# Patient Record
Sex: Female | Born: 1970
Health system: Southern US, Community
[De-identification: ages and names within clinical notes are randomized; demographics above are authoritative.]

## PROBLEM LIST (undated history)

## (undated) DIAGNOSIS — R011 Cardiac murmur, unspecified: Secondary | ICD-10-CM

## (undated) DIAGNOSIS — E119 Type 2 diabetes mellitus without complications: Secondary | ICD-10-CM

## (undated) DIAGNOSIS — E785 Hyperlipidemia, unspecified: Secondary | ICD-10-CM

## (undated) DIAGNOSIS — D649 Anemia, unspecified: Secondary | ICD-10-CM

---

## 2002-10-11 ENCOUNTER — Ambulatory Visit (HOSPITAL_COMMUNITY): Admission: RE | Admit: 2002-10-11 | Discharge: 2002-10-11 | Payer: Self-pay | Admitting: *Deleted

## 2002-12-26 ENCOUNTER — Ambulatory Visit (HOSPITAL_COMMUNITY): Admission: RE | Admit: 2002-12-26 | Discharge: 2002-12-26 | Payer: Self-pay | Admitting: *Deleted

## 2003-03-03 ENCOUNTER — Inpatient Hospital Stay (HOSPITAL_COMMUNITY): Admission: AD | Admit: 2003-03-03 | Discharge: 2003-03-05 | Payer: Self-pay | Admitting: Obstetrics & Gynecology

## 2006-12-18 ENCOUNTER — Ambulatory Visit: Payer: Self-pay | Admitting: Obstetrics & Gynecology

## 2007-01-28 ENCOUNTER — Ambulatory Visit: Payer: Self-pay | Admitting: Obstetrics & Gynecology

## 2007-01-28 ENCOUNTER — Encounter: Payer: Self-pay | Admitting: Obstetrics & Gynecology

## 2007-01-28 ENCOUNTER — Other Ambulatory Visit: Admission: RE | Admit: 2007-01-28 | Discharge: 2007-01-28 | Payer: Self-pay | Admitting: Obstetrics & Gynecology

## 2007-02-11 ENCOUNTER — Ambulatory Visit: Payer: Self-pay | Admitting: Obstetrics & Gynecology

## 2007-10-08 ENCOUNTER — Ambulatory Visit: Payer: Self-pay | Admitting: *Deleted

## 2007-10-08 ENCOUNTER — Ambulatory Visit: Payer: Self-pay | Admitting: Internal Medicine

## 2007-10-08 ENCOUNTER — Encounter (INDEPENDENT_AMBULATORY_CARE_PROVIDER_SITE_OTHER): Payer: Self-pay | Admitting: Nurse Practitioner

## 2007-10-08 LAB — CONVERTED CEMR LAB
ALT: 16 units/L (ref 0–35)
AST: 17 units/L (ref 0–37)
Albumin: 4.7 g/dL (ref 3.5–5.2)
Alkaline Phosphatase: 154 units/L — ABNORMAL HIGH (ref 39–117)
BUN: 12 mg/dL (ref 6–23)
Basophils Absolute: 0.1 10*3/uL (ref 0.0–0.1)
Basophils Relative: 1 % (ref 0–1)
CO2: 18 meq/L — ABNORMAL LOW (ref 19–32)
Calcium: 9.2 mg/dL (ref 8.4–10.5)
Chloride: 104 meq/L (ref 96–112)
Creatinine, Ser: 0.74 mg/dL (ref 0.40–1.20)
Eosinophils Absolute: 0.1 10*3/uL (ref 0.0–0.7)
Eosinophils Relative: 1 % (ref 0–5)
Glucose, Bld: 260 mg/dL — ABNORMAL HIGH (ref 70–99)
HCT: 42.9 % (ref 36.0–46.0)
Hemoglobin: 13.2 g/dL (ref 12.0–15.0)
Lymphocytes Relative: 31 % (ref 12–46)
Lymphs Abs: 3 10*3/uL (ref 0.7–4.0)
MCHC: 30.8 g/dL (ref 30.0–36.0)
MCV: 76.5 fL — ABNORMAL LOW (ref 78.0–100.0)
Microalb, Ur: 0.2 mg/dL (ref 0.00–1.89)
Monocytes Absolute: 0.5 10*3/uL (ref 0.1–1.0)
Monocytes Relative: 5 % (ref 3–12)
Neutro Abs: 5.9 10*3/uL (ref 1.7–7.7)
Neutrophils Relative %: 62 % (ref 43–77)
Platelets: 227 10*3/uL (ref 150–400)
Potassium: 4.1 meq/L (ref 3.5–5.3)
RBC: 5.61 M/uL — ABNORMAL HIGH (ref 3.87–5.11)
RDW: 15 % (ref 11.5–15.5)
Sodium: 137 meq/L (ref 135–145)
TSH: 1.332 microintl units/mL (ref 0.350–5.50)
Total Bilirubin: 0.4 mg/dL (ref 0.3–1.2)
Total Protein: 8 g/dL (ref 6.0–8.3)
WBC: 9.6 10*3/uL (ref 4.0–10.5)

## 2007-10-26 ENCOUNTER — Encounter (INDEPENDENT_AMBULATORY_CARE_PROVIDER_SITE_OTHER): Payer: Self-pay | Admitting: Nurse Practitioner

## 2007-10-26 ENCOUNTER — Ambulatory Visit: Payer: Self-pay | Admitting: Family Medicine

## 2007-10-26 LAB — CONVERTED CEMR LAB
Cholesterol: 228 mg/dL — ABNORMAL HIGH (ref 0–200)
HDL: 44 mg/dL (ref >39)
LDL Cholesterol: 157 mg/dL — ABNORMAL HIGH (ref 0–99)
Total CHOL/HDL Ratio: 5.2
Triglycerides: 137 mg/dL (ref <150)
VLDL: 27 mg/dL (ref 0–40)

## 2008-02-21 ENCOUNTER — Ambulatory Visit: Payer: Self-pay | Admitting: Internal Medicine

## 2008-02-21 LAB — CONVERTED CEMR LAB
ALT: 11 units/L (ref 0–35)
AST: 10 units/L (ref 0–37)
Albumin: 4.5 g/dL (ref 3.5–5.2)
Alkaline Phosphatase: 85 units/L (ref 39–117)
BUN: 9 mg/dL (ref 6–23)
CO2: 21 meq/L (ref 19–32)
Calcium: 9.5 mg/dL (ref 8.4–10.5)
Chloride: 108 meq/L (ref 96–112)
Creatinine, Ser: 0.57 mg/dL (ref 0.40–1.20)
Glucose, Bld: 117 mg/dL — ABNORMAL HIGH (ref 70–99)
Potassium: 3.9 meq/L (ref 3.5–5.3)
Sodium: 140 meq/L (ref 135–145)
Total Bilirubin: 0.5 mg/dL (ref 0.3–1.2)
Total Protein: 7.8 g/dL (ref 6.0–8.3)

## 2008-03-03 ENCOUNTER — Ambulatory Visit: Payer: Self-pay | Admitting: Internal Medicine

## 2008-04-04 ENCOUNTER — Ambulatory Visit: Payer: Self-pay | Admitting: Internal Medicine

## 2008-04-04 ENCOUNTER — Encounter: Payer: Self-pay | Admitting: Family Medicine

## 2008-04-04 LAB — CONVERTED CEMR LAB
ALT: 11 units/L (ref 0–35)
AST: 13 units/L (ref 0–37)
Albumin: 4.8 g/dL (ref 3.5–5.2)
Alkaline Phosphatase: 91 units/L (ref 39–117)
BUN: 11 mg/dL (ref 6–23)
CO2: 21 meq/L (ref 19–32)
Calcium: 9.5 mg/dL (ref 8.4–10.5)
Chloride: 104 meq/L (ref 96–112)
Cholesterol: 213 mg/dL — ABNORMAL HIGH (ref 0–200)
Creatinine, Ser: 0.59 mg/dL (ref 0.40–1.20)
Glucose, Bld: 117 mg/dL — ABNORMAL HIGH (ref 70–99)
HDL: 47 mg/dL (ref >39)
LDL Cholesterol: 137 mg/dL — ABNORMAL HIGH (ref 0–99)
Potassium: 4.3 meq/L (ref 3.5–5.3)
Sodium: 140 meq/L (ref 135–145)
Total Bilirubin: 0.4 mg/dL (ref 0.3–1.2)
Total CHOL/HDL Ratio: 4.5
Total Protein: 8.3 g/dL (ref 6.0–8.3)
Triglycerides: 146 mg/dL (ref <150)
VLDL: 29 mg/dL (ref 0–40)

## 2008-07-07 ENCOUNTER — Ambulatory Visit: Payer: Self-pay | Admitting: Internal Medicine

## 2008-07-07 ENCOUNTER — Encounter: Payer: Self-pay | Admitting: Family Medicine

## 2008-07-07 LAB — CONVERTED CEMR LAB
ALT: 8 units/L (ref 0–35)
AST: 10 units/L (ref 0–37)
Albumin: 4.3 g/dL (ref 3.5–5.2)
Alkaline Phosphatase: 78 units/L (ref 39–117)
BUN: 11 mg/dL (ref 6–23)
CO2: 22 meq/L (ref 19–32)
Calcium: 9 mg/dL (ref 8.4–10.5)
Chloride: 106 meq/L (ref 96–112)
Cholesterol: 200 mg/dL (ref 0–200)
Creatinine, Ser: 0.58 mg/dL (ref 0.40–1.20)
Glucose, Bld: 114 mg/dL — ABNORMAL HIGH (ref 70–99)
HDL: 42 mg/dL (ref >39)
LDL Cholesterol: 132 mg/dL — ABNORMAL HIGH (ref 0–99)
Microalb, Ur: 0.2 mg/dL (ref 0.00–1.89)
Potassium: 4.3 meq/L (ref 3.5–5.3)
Sodium: 140 meq/L (ref 135–145)
Total Bilirubin: 0.4 mg/dL (ref 0.3–1.2)
Total CHOL/HDL Ratio: 4.8
Total Protein: 7.8 g/dL (ref 6.0–8.3)
Triglycerides: 130 mg/dL (ref <150)
VLDL: 26 mg/dL (ref 0–40)

## 2008-09-15 ENCOUNTER — Encounter: Payer: Self-pay | Admitting: Family Medicine

## 2008-09-15 ENCOUNTER — Ambulatory Visit: Payer: Self-pay | Admitting: Internal Medicine

## 2008-09-15 LAB — CONVERTED CEMR LAB
Chlamydia, DNA Probe: NEGATIVE
GC Probe Amp, Genital: NEGATIVE

## 2009-01-02 ENCOUNTER — Ambulatory Visit: Payer: Self-pay | Admitting: Internal Medicine

## 2009-01-12 ENCOUNTER — Ambulatory Visit: Payer: Self-pay | Admitting: Family Medicine

## 2009-04-20 ENCOUNTER — Ambulatory Visit: Payer: Self-pay | Admitting: Internal Medicine

## 2009-06-15 ENCOUNTER — Ambulatory Visit: Payer: Self-pay | Admitting: Internal Medicine

## 2009-06-15 ENCOUNTER — Encounter: Payer: Self-pay | Admitting: Family Medicine

## 2009-11-16 ENCOUNTER — Ambulatory Visit: Payer: Self-pay | Admitting: Internal Medicine

## 2009-11-16 LAB — CONVERTED CEMR LAB
ALT: 19 units/L (ref 0–35)
AST: 17 units/L (ref 0–37)
Albumin: 4.5 g/dL (ref 3.5–5.2)
Alkaline Phosphatase: 60 units/L (ref 39–117)
BUN: 12 mg/dL (ref 6–23)
Basophils Absolute: 0 10*3/uL (ref 0.0–0.1)
Basophils Relative: 0 % (ref 0–1)
CO2: 21 meq/L (ref 19–32)
Calcium: 8.8 mg/dL (ref 8.4–10.5)
Chloride: 106 meq/L (ref 96–112)
Creatinine, Ser: 0.64 mg/dL (ref 0.40–1.20)
Eosinophils Absolute: 0 10*3/uL (ref 0.0–0.7)
Eosinophils Relative: 1 % (ref 0–5)
Ferritin: 2 ng/mL — ABNORMAL LOW (ref 10–291)
Glucose, Bld: 67 mg/dL — ABNORMAL LOW (ref 70–99)
HCT: 34.6 % — ABNORMAL LOW (ref 36.0–46.0)
Hemoglobin: 10.2 g/dL — ABNORMAL LOW (ref 12.0–15.0)
Lymphocytes Relative: 26 % (ref 12–46)
Lymphs Abs: 2.1 10*3/uL (ref 0.7–4.0)
MCHC: 29.5 g/dL — ABNORMAL LOW (ref 30.0–36.0)
MCV: 71.3 fL — ABNORMAL LOW (ref 78.0–100.0)
Monocytes Absolute: 0.6 10*3/uL (ref 0.1–1.0)
Monocytes Relative: 7 % (ref 3–12)
Neutro Abs: 5.4 10*3/uL (ref 1.7–7.7)
Neutrophils Relative %: 67 % (ref 43–77)
Platelets: 288 10*3/uL (ref 150–400)
Potassium: 3.7 meq/L (ref 3.5–5.3)
RBC: 4.85 M/uL (ref 3.87–5.11)
RDW: 16.2 % — ABNORMAL HIGH (ref 11.5–15.5)
Sodium: 140 meq/L (ref 135–145)
Total Bilirubin: 0.6 mg/dL (ref 0.3–1.2)
Total Protein: 7.1 g/dL (ref 6.0–8.3)
WBC: 8.2 10*3/uL (ref 4.0–10.5)

## 2010-06-07 ENCOUNTER — Encounter (INDEPENDENT_AMBULATORY_CARE_PROVIDER_SITE_OTHER): Payer: Self-pay | Admitting: *Deleted

## 2010-06-07 LAB — CONVERTED CEMR LAB
ALT: 13 units/L (ref 0–35)
AST: 14 units/L (ref 0–37)
Albumin: 4.8 g/dL (ref 3.5–5.2)
Alkaline Phosphatase: 71 units/L (ref 39–117)
BUN: 11 mg/dL (ref 6–23)
Basophils Absolute: 0 10*3/uL (ref 0.0–0.1)
Basophils Relative: 0 % (ref 0–1)
CO2: 24 meq/L (ref 19–32)
Calcium: 9.7 mg/dL (ref 8.4–10.5)
Chloride: 106 meq/L (ref 96–112)
Cholesterol: 196 mg/dL (ref 0–200)
Creatinine, Ser: 0.58 mg/dL (ref 0.40–1.20)
Eosinophils Absolute: 0.1 10*3/uL (ref 0.0–0.7)
Eosinophils Relative: 1 % (ref 0–5)
Glucose, Bld: 74 mg/dL (ref 70–99)
HCT: 40.3 % (ref 36.0–46.0)
HDL: 53 mg/dL (ref >39)
Hemoglobin: 12.2 g/dL (ref 12.0–15.0)
Hgb A1c MFr Bld: 6.6 % — ABNORMAL HIGH (ref <5.7)
LDL Cholesterol: 121 mg/dL — ABNORMAL HIGH (ref 0–99)
Lymphocytes Relative: 27 % (ref 12–46)
Lymphs Abs: 2.3 10*3/uL (ref 0.7–4.0)
MCHC: 30.3 g/dL (ref 30.0–36.0)
MCV: 74.6 fL — ABNORMAL LOW (ref 78.0–100.0)
Microalb, Ur: 0.5 mg/dL (ref 0.00–1.89)
Monocytes Absolute: 0.6 10*3/uL (ref 0.1–1.0)
Monocytes Relative: 7 % (ref 3–12)
Neutro Abs: 5.5 10*3/uL (ref 1.7–7.7)
Neutrophils Relative %: 65 % (ref 43–77)
Platelets: 264 10*3/uL (ref 150–400)
Potassium: 4 meq/L (ref 3.5–5.3)
RBC: 5.4 M/uL — ABNORMAL HIGH (ref 3.87–5.11)
RDW: 20.5 % — ABNORMAL HIGH (ref 11.5–15.5)
Sodium: 139 meq/L (ref 135–145)
Total Bilirubin: 0.4 mg/dL (ref 0.3–1.2)
Total CHOL/HDL Ratio: 3.7
Total Protein: 7.8 g/dL (ref 6.0–8.3)
Triglycerides: 110 mg/dL (ref <150)
VLDL: 22 mg/dL (ref 0–40)
WBC: 8.5 10*3/uL (ref 4.0–10.5)

## 2010-06-10 ENCOUNTER — Encounter (INDEPENDENT_AMBULATORY_CARE_PROVIDER_SITE_OTHER): Payer: Self-pay | Admitting: Internal Medicine

## 2010-06-10 LAB — CONVERTED CEMR LAB: Ferritin: 23 ng/mL (ref 10–291)

## 2010-09-13 ENCOUNTER — Encounter (INDEPENDENT_AMBULATORY_CARE_PROVIDER_SITE_OTHER): Payer: Self-pay | Admitting: *Deleted

## 2010-09-13 LAB — CONVERTED CEMR LAB
ALT: 35 units/L (ref 0–35)
Albumin: 4.7 g/dL (ref 3.5–5.2)
CO2: 27 meq/L (ref 19–32)
Calcium: 9.4 mg/dL (ref 8.4–10.5)
Chloride: 104 meq/L (ref 96–112)
Sodium: 140 meq/L (ref 135–145)
TSH: 1.446 microintl units/mL (ref 0.350–4.500)
Total Protein: 7.7 g/dL (ref 6.0–8.3)

## 2010-11-15 ENCOUNTER — Other Ambulatory Visit: Payer: Self-pay | Admitting: Family Medicine

## 2010-12-17 NOTE — Group Therapy Note (Signed)
Heidi Shaw, Heidi Shaw NO.:  [id number]   MEDICAL RECORD NO.:  [id number]          PATIENT TYPE:  WOC   LOCATION:  [facility]                   FACILITY:  [facility]   PHYSICIAN:  Elsie Lincoln, MD      DATE OF BIRTH:  1970/09/06   DATE OF SERVICE:                                  CLINIC NOTE   The patient is a 40 year old G4, P4 female who has [facility]  [facility]_ .  The patient had a  colposcopy which showed a lesion at 12 o'clock going in to the  __________  cervical canal.  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  Given the patient's age  of 40 years and also not good visualization of the lesion at 12 o'clock,  I believe an excisional procedure is warranted.  I suggest a LEEP, and  the patient has watched the LEEP video today.   PAST MEDICAL HISTORY:  Denies all problems.   PAST SURGICAL HISTORY:  No surgeries except for a C-section.   OB HISTORY:  She has had 3 vaginal deliveries, 1 C-secton, all children  are living.   GYN HISTORY:  Depo-Provera for birth control.  Menarche at 28.   SOCIAL HISTORY:  No smoking, 2 caffeinated beverages a day.  No history  of abuse.   REVIEW OF SYSTEMS:  Negative.   ALLERGIES:  DENIES.   MEDICATIONS:  Depo-Provera.   ASSESSMENT AND PLAN:  41. 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  2. Excisional procedures scheduled.           ______________________________  Elsie Lincoln, MD     KL/MEDQ  D:  12/18/2006  T:  12/18/2006  Job:  [id number]

## 2014-03-03 ENCOUNTER — Other Ambulatory Visit: Payer: Self-pay | Admitting: Nurse Practitioner

## 2014-03-03 DIAGNOSIS — Z1231 Encounter for screening mammogram for malignant neoplasm of breast: Secondary | ICD-10-CM

## 2014-03-17 ENCOUNTER — Ambulatory Visit
Admission: RE | Admit: 2014-03-17 | Discharge: 2014-03-17 | Disposition: A | Discharge status: Home / Self Care | Payer: No Typology Code available for payment source | Source: Ambulatory Visit | Attending: Nurse Practitioner | Admitting: Nurse Practitioner

## 2014-03-17 ENCOUNTER — Encounter (INDEPENDENT_AMBULATORY_CARE_PROVIDER_SITE_OTHER): Payer: Self-pay

## 2014-03-17 DIAGNOSIS — Z1231 Encounter for screening mammogram for malignant neoplasm of breast: Secondary | ICD-10-CM

## 2014-03-20 ENCOUNTER — Other Ambulatory Visit: Payer: Self-pay | Admitting: Nurse Practitioner

## 2014-03-20 DIAGNOSIS — R928 Other abnormal and inconclusive findings on diagnostic imaging of breast: Secondary | ICD-10-CM

## 2014-03-24 ENCOUNTER — Other Ambulatory Visit: Payer: Self-pay

## 2014-03-24 ENCOUNTER — Other Ambulatory Visit: Payer: Self-pay | Admitting: Nurse Practitioner

## 2014-03-24 DIAGNOSIS — R928 Other abnormal and inconclusive findings on diagnostic imaging of breast: Secondary | ICD-10-CM

## 2014-03-30 ENCOUNTER — Ambulatory Visit
Admission: RE | Admit: 2014-03-30 | Discharge: 2014-03-30 | Disposition: A | Discharge status: Home / Self Care | Payer: No Typology Code available for payment source | Source: Ambulatory Visit | Attending: Nurse Practitioner | Admitting: Nurse Practitioner

## 2014-03-30 DIAGNOSIS — R928 Other abnormal and inconclusive findings on diagnostic imaging of breast: Secondary | ICD-10-CM

## 2014-06-02 ENCOUNTER — Encounter: Payer: Self-pay | Admitting: Obstetrics & Gynecology

## 2014-07-12 ENCOUNTER — Encounter (HOSPITAL_COMMUNITY): Payer: Self-pay

## 2014-07-12 ENCOUNTER — Ambulatory Visit (HOSPITAL_COMMUNITY)
Admission: RE | Admit: 2014-07-12 | Discharge: 2014-07-12 | Disposition: A | Discharge status: Home / Self Care | Payer: Self-pay | Source: Ambulatory Visit | Attending: Obstetrics and Gynecology | Admitting: Obstetrics and Gynecology

## 2014-07-12 VITALS — BP 120/84 | Temp 98.1°F | Wt 145.4 lb

## 2014-07-12 DIAGNOSIS — R87612 Low grade squamous intraepithelial lesion on cytologic smear of cervix (LGSIL): Secondary | ICD-10-CM

## 2014-07-12 DIAGNOSIS — Z1239 Encounter for other screening for malignant neoplasm of breast: Secondary | ICD-10-CM

## 2014-07-12 HISTORY — DX: Type 2 diabetes mellitus without complications: E11.9

## 2014-07-12 HISTORY — DX: Anemia, unspecified: D64.9

## 2014-07-12 HISTORY — DX: Hyperlipidemia, unspecified: E78.5

## 2014-07-12 HISTORY — DX: Cardiac murmur, unspecified: R01.1

## 2014-07-12 NOTE — Addendum Note (Signed)
Encounter addended by: Priscille Heidelberghristine P Florian Chauca, RN on: 07/12/2014  4:23 PM<BR>     Documentation filed: Patient Instructions Section

## 2014-07-12 NOTE — Progress Notes (Signed)
[facility] due to needing follow-up for an abnormal Pap smear. Patient complained of an occasional tingling sensation in breast that has been going on for 6 months.  Pap Smear: Pap smear not completed today. Last Pap smear was 01/24/2014 at [facility]. [facility] for a colposcopy per recommendation to follow-up for abnormal Pap smear. Appointment scheduled for Monday, July 24, 2014 at 1415. Per patient has a history of an abnormal Pap smear 7 years ago that required a colposcopy for follow-up. Last Pap smear result is in EPIC under media.  Physical exam: Breasts Breasts symmetrical. No skin abnormalities bilateral breasts. No nipple retraction bilateral breasts. No nipple discharge bilateral breasts. No lymphadenopathy. No lumps palpated bilateral breasts. No complaints of pain or tenderness on exam. Screening mammogram recommended in August 2016 that will be one year from previous mammogram.  Pelvic/Bimanual No Pap smear completed today since last Pap smear was 01/24/2014. Pap smear not indicated per [facility] guidelines.

## 2014-07-12 NOTE — Patient Instructions (Addendum)
Explained to Heidi LarsenMaria Shaw the follow-up needed for her abnormal Pap smear. [facility] for a colposcopy per recommendation to follow-up for abnormal Pap smear. Appointment scheduled for Monday, July 24, 2014 at 1415. Screening mammogram recommended in August 2016 that will be one year from previous mammogram.Told patient that [facility] will cover her next mammogram and to call Sabrina to schedule. Heidi LarsenMaria Shaw verbalized understanding.  Mauria Asquith, [name], RN 2:50 PM

## 2014-07-24 ENCOUNTER — Encounter: Payer: Self-pay | Admitting: Obstetrics & Gynecology

## 2014-07-24 ENCOUNTER — Other Ambulatory Visit (HOSPITAL_COMMUNITY)
Admission: RE | Admit: 2014-07-24 | Discharge: 2014-07-24 | Disposition: A | Discharge status: Home / Self Care | Payer: Self-pay | Source: Ambulatory Visit | Attending: Obstetrics & Gynecology | Admitting: Obstetrics & Gynecology

## 2014-07-24 ENCOUNTER — Ambulatory Visit (INDEPENDENT_AMBULATORY_CARE_PROVIDER_SITE_OTHER): Payer: Self-pay | Admitting: Obstetrics & Gynecology

## 2014-07-24 VITALS — BP 153/92 | HR 68 | Temp 98.3°F | Ht 61.0 in | Wt 144.8 lb

## 2014-07-24 DIAGNOSIS — N942 Vaginismus: Secondary | ICD-10-CM

## 2014-07-24 DIAGNOSIS — R896 Abnormal cytological findings in specimens from other organs, systems and tissues: Secondary | ICD-10-CM

## 2014-07-24 DIAGNOSIS — IMO0002 Reserved for concepts with insufficient information to code with codable children: Secondary | ICD-10-CM

## 2014-07-24 LAB — POCT PREGNANCY, URINE: PREG TEST UR: NEGATIVE

## 2014-07-24 NOTE — Progress Notes (Signed)
Patient ID: Heidi LarsenMaria Shaw, female   DOB: 1970-10-27, 43 y.o.   MRN: [id number] Patient given informed consent, signed copy in the chart, time out was performed.  Placed in lithotomy position. Cervix viewed with speculum and colposcope after application of acetic acid.  June /2015 LSIL PAP  Colposcopy adequate?  yes Acetowhite lesions?yes Punctation?no Mosaicism?  no Abnormal vasculature?  Very friable; difficult to assess Biopsies?yes at 6 o'clock ECC?no PAP repeated  Patient was given post procedure instructions.  We will call with results.  May need LEEP if mod or severe dysplasia   Delos Klich L. Harraway-Smith, M.D., Evern CoreFACOG

## 2014-07-24 NOTE — Progress Notes (Signed)
Heidi Shaw present as Spanish Interpreter 

## 2014-07-24 NOTE — Patient Instructions (Signed)
Displasia cervical (Cervical Dysplasia) La displasia cervical es una afeccin que ocurre cuando una mujer presenta cambios anormales en las clulas del cuello del tero. El cuello del tero es la abertura del tero. Se encuentra entre la vagina y Nurse, learning disability. La displasia cervical puede ser el primer signo de cncer de cuello del tero.  Casi todos los casos de displasia cervical pueden curarse gracias a la deteccin temprana, el tratamiento y controles rigurosos. Si no se la trata, puede agravarse.  CAUSAS  Una infeccin causada por el virus del Engineer, technical sales (VPH) puede provocar la displasia cervical. FACTORES DE RIESGO   Haber padecido una enfermedad de transmisin sexual, como clamidia o infeccin por el VPH.  Ser sexualmente activa antes de los 18 aos.  Haber tenido ms de 1 compaero sexual.  No usar proteccin [employer] sexuales, especialmente con compaeros sexuales nuevos.  Haber sufrido cncer en la vagina o en la vulva.  Haber tenido un compaero sexual cuya pareja anterior padeci cncer de cuello del tero o displasia cervical.  Tener un compaero sexual que tiene o ha tenido cncer de pene.  Presentar un debilitamiento del sistema inmunitario (a causa del VIH o de un trasplante de rgano).  Ser hija de una mujer que tom dietilestilbestrol (DES) durante el embarazo.  Tener antecedentes familiares de cncer de cuello del tero.  El hbito de fumar. SIGNOS Y SNTOMAS  Generalmente, no se presentan sntomas. Si hay sntomas, estos pueden incluir:   Flujo vaginal anormal.  Sangrado entre perodos menstruales o despus de Retail banker.  Sangrado durante la menopausia.  [place] (dispareunia). DIAGNSTICO  Se puede realizar una prueba de Papanicolaou. Durante esta prueba, se extraen clulas del cuello del tero y luego se analizan con un microscopio. Tambin puede realizarse una prueba en la que se extirpa  tejido del cuello del tero (biopsia), si la prueba de Papanicolaou es anormal o si el aspecto del cuello del tero es anormal.  TRATAMIENTO  El tratamiento vara en funcin de la gravedad de la displasia cervical. El tratamiento puede incluir:  Crioterapia. Durante la crioterapia, las clulas anormales se congelan con un instrumento con punta de acero.  Un procedimiento para extirpar tejido anormal del cuello del tero.  Ciruga para extirpar tejido anormal. Generalmente, se lleva a cabo en casos graves de displasia cervical. Las opciones quirrgicas son:  Biopsia en cono. Este es un procedimiento en el que se extirpan el canal cervical y Ardelia Mems parte del centro del cuello del tero.  Histerectoma En esta ciruga se extirpan el tero y el cuello del tero. INSTRUCCIONES PARA EL CUIDADO EN EL HOGAR   Slo tome medicamentos de venta libre o recetados para Glass blower/designer o Health and safety inspector, segn las indicaciones de su mdico.  No utilice tampones, duchas vaginales ni tenga relaciones sexuales hasta que el profesional la autorice.  Cumpla con todas las visitas de control, segn le indique su mdico. Las mujeres que han recibido tratamiento para la displasia cervical deberan someterse a pruebas de Papanicolaou y exmenes plvicos de forma regular. Durante el primer ao despus del tratamiento de la displasia cervical, la prueba de Papanicolaou se debe realizar cada 3 a 4 meses. En el segundo ao, se debe realizar cada 6 meses o segn lo recomendado por el mdico.  Para evitar la recurrencia de la afeccin, practique el sexo seguro. SOLICITE ATENCIN MDICA SI:  Presenta verrugas genitales.  SOLICITE ATENCIN MDICA DE INMEDIATO SI:   El perodo menstrual  es ms abundante que lo normal.  Presenta un sangrado rojo brillante, especialmente si tiene cogulos sanguneos.  Tiene fiebre.  Siente clicos o dolor que Lesothoaumenta y no se alivia con medicamentos.  Se siente mareada, inusualmente dbil o  se desmaya.  Tiene flujo vaginal anormal.  Siente dolor abdominal. Document Released: 05/11/2013 [facility] Patient Information 2015 [place], [place]. This information is not intended to replace advice given to you by your health care provider. Make sure you discuss any questions you have with your health care provider.

## 2014-07-25 LAB — CYTOLOGY - PAP

## 2014-07-31 ENCOUNTER — Telehealth: Payer: Self-pay

## 2014-07-31 NOTE — Telephone Encounter (Signed)
-----   Message from Heidi PhenixJames G Arnold, MD sent at 07/29/2014  5:17 PM EST ----- Patient of Dr Erin FullingHarraway-Smith, no dysplasia, repeat pap and cotesting 1 yr

## 2014-07-31 NOTE — Telephone Encounter (Signed)
Called pt with Spanish interpreter Heidi Shaw and informed pt that her pap was normal and that the provider would like for her to get another pap with HPV in one year.  Pt stated understanding.

## 2014-08-07 ENCOUNTER — Encounter: Payer: Self-pay | Admitting: *Deleted

## 2015-03-09 ENCOUNTER — Other Ambulatory Visit: Payer: Self-pay

## 2015-03-09 DIAGNOSIS — Z1231 Encounter for screening mammogram for malignant neoplasm of breast: Secondary | ICD-10-CM

## 2015-04-23 ENCOUNTER — Ambulatory Visit
Admission: RE | Admit: 2015-04-23 | Discharge: 2015-04-23 | Disposition: A | Discharge status: Home / Self Care | Payer: No Typology Code available for payment source | Source: Ambulatory Visit

## 2015-04-23 DIAGNOSIS — Z1231 Encounter for screening mammogram for malignant neoplasm of breast: Secondary | ICD-10-CM

## 2015-08-08 ENCOUNTER — Telehealth (HOSPITAL_COMMUNITY): Payer: Self-pay | Admitting: *Deleted

## 2015-08-08 NOTE — Telephone Encounter (Signed)
Telephoned patient at home # and confirmed appointment for Jan 5 with [facility]. Used interpreter Delorise RoyalsJulie Sowell.

## 2015-08-09 ENCOUNTER — Ambulatory Visit (HOSPITAL_COMMUNITY)
Admission: RE | Admit: 2015-08-09 | Discharge: 2015-08-09 | Disposition: A | Discharge status: Home / Self Care | Payer: Self-pay | Source: Ambulatory Visit | Attending: Obstetrics and Gynecology | Admitting: Obstetrics and Gynecology

## 2015-08-09 ENCOUNTER — Ambulatory Visit (HOSPITAL_COMMUNITY): Payer: Self-pay

## 2015-08-09 ENCOUNTER — Encounter (HOSPITAL_COMMUNITY): Payer: Self-pay

## 2015-08-09 VITALS — BP 110/70 | Temp 98.0°F | Ht 63.0 in | Wt 137.0 lb

## 2015-08-09 DIAGNOSIS — Z01419 Encounter for gynecological examination (general) (routine) without abnormal findings: Secondary | ICD-10-CM

## 2015-08-09 NOTE — Progress Notes (Signed)
No complaints today.  Pap Smear: Pap smear completed today. Last Pap smear was 01/24/2014 at [facility]. Patient had a follow-up colposcopy completed 07/24/2014 that was benign to follow up for abnormal Pap smear. Per patient has a history of an abnormal Pap smear 8 years ago that required a colposcopy for follow-up. Last Pap smear result is in EPIC under media.  Physical exam: Breasts Breasts symmetrical. No skin abnormalities bilateral breasts. No nipple retraction bilateral breasts. No nipple discharge bilateral breasts. No lymphadenopathy. No lumps palpated bilateral breasts. No complaints of pain or tenderness on exam. Screening mammogram due in September 2017.  Pelvic/Bimanual   Ext Genitalia No lesions, no swelling and no discharge observed on external genitalia.         Vagina Vagina pink and normal texture. No lesions or discharge observed in vagina.          Cervix Cervix is present. Cervix pink and of normal texture. No discharge observed.     Uterus Uterus is present and palpable. Uterus in normal position and normal size.        Adnexae Bilateral ovaries present and palpable. No tenderness on palpation.          Rectovaginal No rectal exam completed today since patient had no rectal complaints. No skin abnormalities observed on exam.    Smoking History: Patient has never smoked.  Patient Navigation: Patient education provided. [facility]. Spanish interpreter provided.  Used Spanish Interpreter Sara LeeJulia Sowell

## 2015-08-09 NOTE — Patient Instructions (Signed)
Educational materials on self breast awareness given. Explained to Heidi Shaw that her next Pap smear will be due in one year if today's Pap smear is normal. [facility]. Told patient to call the Breast Center to schedule. Let patient know will follow up with her within the next couple weeks with results of Pap smear by phone. Heidi Shaw verbalized understanding.  Fannie Alomar, [name], RN 3:18 PM

## 2015-08-14 LAB — CYTOLOGY - PAP

## 2015-08-15 ENCOUNTER — Telehealth (HOSPITAL_COMMUNITY): Payer: Self-pay | Admitting: *Deleted

## 2015-08-15 NOTE — Telephone Encounter (Signed)
Telephoned patient at home # and left message to return call to [facility] 

## 2015-08-16 ENCOUNTER — Ambulatory Visit (HOSPITAL_BASED_OUTPATIENT_CLINIC_OR_DEPARTMENT_OTHER): Payer: Self-pay

## 2015-08-16 ENCOUNTER — Other Ambulatory Visit: Payer: Self-pay

## 2015-08-16 VITALS — BP 120/80 | HR 72 | Temp 98.1°F | Resp 16 | Ht 62.0 in | Wt 136.0 lb

## 2015-08-16 DIAGNOSIS — Z0000 Encounter for general adult medical examination without abnormal findings: Secondary | ICD-10-CM

## 2015-08-16 LAB — GLUCOSE (CC13): Glucose: 98 mg/dl (ref 70–140)

## 2015-08-16 NOTE — Progress Notes (Signed)
[facility] and is currently a [facility] patient effective 08/09/2015 with interpreter Delorise RoyalsJulie Sowell.   Clinical Measurements: Patient is 5 ft., 2 inches, weight 136 lbs, and BMI 24.6.  Medical History: Patient has history of high cholesterol and diabetes. Patient is on ASA, Metformin and glipizide. Patient does not have a history of hypertension. Per patient no diagnosed history of coronary heart disease, heart attack, heart failure, stroke/TIA, vascular disease or congenital heart defects.   Blood Pressure, Self-measurement: Patient does nor see a need to check blood pressure.  Nutrition Assessment: Patient stated that eats 2-3 fruits a week. Patient states she eats at least one serving of vegetables a day. Per patient states does eat 3 or more ounces of whole grains daily. Patient stated does not eat two or more servings of fish weekly. Patient stated that did not like fish. Patient states she does drink more than 36 ounces or 450 calories of beverages with added sugars weekly but stated has cut back significantly by cutting out soda and decreasing about of sugar she drinks in coffee. Patient stated she does watch her salt intake. Marland Kitchen.  Physical Activity Assessment: Patient stated that cleans and walks for 30 minutes a day for 7 days. Patient does around 210  minutes of moderate exercise a week and sometimes more. Per patient does not do vigorous exercises.  Smoking Status: Patient has never smoked and is not exposed to smoke.   Quality of Life Assessment: In assessing patient's quality of life she stated that out of the past 30 days that she has felt her physical health was good for all days. Patient also stated that in the past 30 days that her mental health was not good including stress, depression and problems with emotions for 15 to 20 days. Patient stated that stress relates to amount of time get to work. Per patient due to holidays and snow has not worked as  much. Patient did state that out of the past 30 days she felt her physical or mental health had  kept her from doing her usual activities including self-care, work or recreation for 2 days.   Plan: Lab work will be done today including a lipid panel, blood glucose, and Hgb A1C. Will call lab results when they are finished. Will discuss risk reduction counseling when call results.

## 2015-08-16 NOTE — Patient Instructions (Signed)
Will get labs drawn. Will go over labs when receive results. Will try take medications everyday. Patient verbalized understanding.

## 2015-08-17 ENCOUNTER — Telehealth (HOSPITAL_COMMUNITY): Payer: Self-pay | Admitting: *Deleted

## 2015-08-17 LAB — HEMOGLOBIN A1C
Est. average glucose Bld gHb Est-mCnc: 166 mg/dL
Hemoglobin A1c: 7.4 % — ABNORMAL HIGH (ref 4.8–5.6)

## 2015-08-17 LAB — LIPID PANEL
CHOL/HDL RATIO: 5.5 ratio — AB (ref 0.0–4.4)
Cholesterol, Total: 204 mg/dL — ABNORMAL HIGH (ref 100–199)
HDL: 37 mg/dL — AB (ref >39)
LDL Calculated: 100 mg/dL — ABNORMAL HIGH (ref 0–99)
Triglycerides: 333 mg/dL — ABNORMAL HIGH (ref 0–149)
VLDL CHOLESTEROL CAL: 67 mg/dL — AB (ref 5–40)

## 2015-08-17 NOTE — Telephone Encounter (Signed)
Telephoned patient at home # and discussed negative pap smear results. HPV was negative. Next pap smear due in one year. Used interpreter Delorise RoyalsJulie Sowell. Patient voiced understanding.

## 2015-08-24 ENCOUNTER — Telehealth: Payer: Self-pay

## 2015-08-24 NOTE — Telephone Encounter (Signed)
alled to inform about lab work from 08/16/15. Interpreter not present. I talked with patient's husband and her.I informed patient: cholesterol- 204, HDL- 37, LDL- 100, triglycerides - 161, Bld Glucose -113 and HBG-A1C - 7.4. Did risk reduction counseling concerning carbohydrates and sugars. Informed patient of doctor's appointment on 08/29/15 at 3:30 PM at [facility]. Patient stated that was aware of appointment and knew the address. Told patient that would call back after doctor's appointment about health coaching or other follow up.

## 2015-08-29 ENCOUNTER — Ambulatory Visit (INDEPENDENT_AMBULATORY_CARE_PROVIDER_SITE_OTHER): Payer: Self-pay | Admitting: Internal Medicine

## 2015-08-29 ENCOUNTER — Encounter: Payer: Self-pay | Admitting: Internal Medicine

## 2015-08-29 VITALS — BP 100/68 | HR 68 | Temp 98.6°F | Ht 62.0 in | Wt 136.0 lb

## 2015-08-29 DIAGNOSIS — E785 Hyperlipidemia, unspecified: Secondary | ICD-10-CM

## 2015-08-29 DIAGNOSIS — E119 Type 2 diabetes mellitus without complications: Secondary | ICD-10-CM | POA: Insufficient documentation

## 2015-08-29 NOTE — Patient Instructions (Signed)
  Diet Recommendations for Diabetes   Starchy (carb) foods: Bread, rice, pasta, potatoes, corn, cereal, grits, crackers, bagels, muffins, all baked goods.  (Fruits, milk, and yogurt also have carbohydrate, but most of these foods will not spike your blood sugar as the starchy foods will.)  A few fruits do cause high blood sugars; use small portions of bananas (limit to 1/2 at a time), grapes, watermelon, oranges, and most tropical fruits.    Protein foods: Meat, fish, poultry, eggs, dairy foods, and beans such as pinto and kidney beans (beans also provide carbohydrate).   1. Eat at least 3 meals and 1-2 snacks per day. Never go more than 4-5 hours while awake without eating. Eat breakfast within the first hour of getting up.   2. Limit starchy foods to TWO per meal and ONE per snack. ONE portion of a starchy  food is equal to the following:   - ONE slice of bread (or its equivalent, such as half of a hamburger bun).   - 1/2 cup of a "scoopable" starchy food such as potatoes or rice.   - 15 grams of carbohydrate as shown on food label.  3. Include at every meal: a protein food, a carb food, and vegetables and/or fruit.   - Obtain twice the volume of veg's as protein or carbohydrate foods for both lunch and dinner.   - Fresh or frozen veg's are best.   - Keep frozen veg's on hand for a quick vegetable serving.       Dieta Recomendaciones para la Diabetes Alimentos de almidn (carb): pan, arroz, pasta, patatas, maz, cereales, smola, galletas saladas, bagels, muffins, todos los productos horneados. (Las frutas, la [place] y el yogur tambin tienen carbohidratos, Biomedical engineer la mayora de estos alimentos no aumentarn el nivel de International aid/development worker en la [place], como lo harn los alimentos con almidn). Algunas frutas causan altos niveles de azcar en la sangre; Use porciones pequeas de pltanos (lmite de 1/2 a la vez), uvas, sanda, naranjas y la [address] de las frutas tropicales.   Alimentos de protenas: carne,  pescado, aves de corral, huevos, productos lcteos y frijoles tales como pinto y frijoles (frijoles tambin proporcionan carbohidratos). 1. Coma al menos 3 comidas y 1-2 bocadillos por da. Nunca vaya ms de 4-5 horas mientras est despierto sin comer. Comer el desayuno en la primera hora de levantarse. 2. Limite los alimentos almidonados a DOS por comida y UN por bocadillo. Una porcin de un alimento almidonado es igual a lo siguiente: - [place] de pan (o su equivalente, como la mitad de un bollo de [place]). - 1/2 taza de un almidn "scoopable" alimentos tales como patatas o arroz. - 15 gramos de carbohidratos como se [place] en la etiqueta de los alimentos. 3. Incluya en cada comida: un alimento de protena, un alimento de carbohidratos y verduras y / o fruta. - Obtener el doble de volumen de verduras como protenas o carbohidratos alimentos tanto para el almuerzo y Physicist, medical.

## 2015-08-29 NOTE — Progress Notes (Signed)
Patient ID: Heidi Shaw, female   DOB: 12-13-1970, 45 y.o.   MRN: [id number]   [facility] [name], MD Phone: [phone]  Reason For Visit: New Patient via [facility]. NO LABS ALLOWED?!  # Hx of Diabetes x 10 yrs: Blood sugar ranges 135-150. No hypoglycemia. Not taking metformin regular for 6 months was taking only 500 mg daily due to absence of prescription refill. Also only takes glipizide and metformin only 4-5 days in a week. Was recently increased from 1000 mg to 2000 mg of metformin due elevated A1C.  Also with the belief that Metformin can hurt the kidneys. Limits carbs per patient (does not eat pasta, rice). Does not always take aspirin. No eye exam in 5 years.   # Hyperlipidemia: Elevated cholesterol + diabetes. Currently on a low dose statin. No issues with this drug. Takes regularly  Past Medical History Reviewed and Documented in Chart  Reviewed problem list.  Medications- reviewed and updated Family history reviewed and updated in Chart Social history- patient is a non-smoker - Occupation - works a Microbiologist for 17 years - Has 4 Kids, 3 older (in 50s) and 1 younger Son (85 yrs old) - Lives with Son (12 year) and Daughter (62s) sometimes stays with them, along with her children   Objective: BP 100/68 mmHg  Pulse 68  Temp(Src) 98.6 F (37 C) (Oral)  Ht  (1.575 m)  Wt 136 lb (61.689 kg)  BMI 24.87 kg/m2  SpO2 99% Gen: NAD, alert, cooperative with exam HEENT: NCAT, PERRL, CV: RRR, good S1/S2, no murmur, radial pulses 2+ Resp: CTABL, no wheezes, non-labored Abd: SNTND, BS present, no guarding or organomegaly Ext: No edema, warm, normal tone, moves UE/LE spontaneously Neuro: Alert and oriented, No gross deficits Skin: no rashes no lesions Diabetic Foot Check -  Appearance - no lesions, ulcers or calluses Skin - no unusual pallor or redness Monofilament testing -  Right - Great toe, medial, central, lateral ball and  posterior foot intact Left - Great toe, medial, central, lateral ball and posterior foot intact   Assessment/Plan:   Hyperlipidemia ASCVD risk 2.7 %, patient with diabetes. Low to moderate intensity statin recommended  - continue pravachol 10 mg -> consider increasing to 20 mg in the future to provide a moderate intensity   Type 2 diabetes mellitus (HCC) - Review of labs noted: A1C 6.6 2011 --> 7.4 2017, blood sugars 135 -160 in the AM, goal less than 120  - Currently on Glipizide 10 mg and Metformin 1000 mg BID (recently increased due to A1C changes)  - However patient taking med only about 4-5 days in a week, not consistently, reaffirmed the need to take daily - Foot exam performed - wnl  - Discussed the need for opthalmology exam (will provide referral at next visit) - Will need to get labs including microalbumin and BMET at next visit, considered lisinopril for renal protection however bp low.  - Counseled patient on diabetically appropriate foods, provided hand out.  - Refilled Aspirin and Blood sugar strips

## 2015-08-30 ENCOUNTER — Encounter: Payer: Self-pay | Admitting: Internal Medicine

## 2015-08-30 MED ORDER — ASPIRIN 81 MG PO TABS: 81.0000 mg | ORAL_TABLET | Freq: Every day | ORAL | Status: DC

## 2015-08-30 NOTE — Assessment & Plan Note (Signed)
-   Review of labs noted: A1C 6.6 2011 --> 7.4 2017, blood sugars 135 -160 in the AM, goal less than 120  - Currently on Glipizide 10 mg and Metformin 1000 mg BID (recently increased due to A1C changes)  - However patient taking med only about 4-5 days in a week, not consistently, reaffirmed the need to take daily - Foot exam performed - wnl  - Discussed the need for opthalmology exam (will provide referral at next visit) - Will need to get labs including microalbumin and BMET at next visit, considered lisinopril for renal protection however bp low.  - Counseled patient on diabetically appropriate foods, provided hand out.  - Refilled Aspirin and Blood sugar strips

## 2015-08-30 NOTE — Assessment & Plan Note (Addendum)
ASCVD risk 2.7 %, patient with diabetes. Low to moderate intensity statin recommended  - continue pravachol 10 mg -> consider increasing to 20 mg in the future to provide a moderate intensity

## 2015-09-03 NOTE — Progress Notes (Signed)
[facility] PATIENT NAVIGATION: Patient was referred to [facility] concerning lab results of elevated Hgb A1C (7.4), HDL 37 and rest of lipid panel elevated. Patient was seen by doctor on 08/29/2015 concerning her present A1C and Lipid Panel.  NEEDS ASSESSMENT: Patient did have barriers additional social support and understanding of services needed. Patient has orange card.  PLAN of CARE; Patient has had access to services but has not been happy with previous services. Will see if patient needs to do eligibilty for [facility] patient assistance and if patient want to change services.    Patient will be approached for Health Coaching or other services to help with problem areas. Will explore possible other barriers patient may perceive.  Eye exam has not been done in 5 years. Will need to make sure services are available and schedules for patient.   PLAN: Call patient for health coaching or other programs. Follow up per phone. Assess any further barriers.

## 2015-09-25 ENCOUNTER — Telehealth: Payer: Self-pay

## 2015-09-25 NOTE — Telephone Encounter (Signed)
Called per Delorise Royals for rescheduling of Health Coaching visit. Scheduled for 3/3 at 3:345.

## 2015-09-26 ENCOUNTER — Ambulatory Visit: Payer: Self-pay

## 2015-10-05 ENCOUNTER — Ambulatory Visit: Payer: Self-pay

## 2015-10-05 DIAGNOSIS — Z789 Other specified health status: Secondary | ICD-10-CM

## 2015-10-05 NOTE — Progress Notes (Signed)
[facility] Patient returns today for Health Coaching. Interpreter Mattie MarlinSilvia Sobalvarro present. Patient coming mainly because all her labs were abnormal and had not understood a lot about diabetes until this last doctor's visit.    HEALTH COACHING:  Patient and I went over lab results and looked at normal range. Patient stated that understood now the importance of taking her medication and was taking it. Per patient her main problem is that she loves bread and torilla's. Patient received handouts in spanish with the first talking about portion sizes for each food group. We then looked at 1400 to 1600 calorie diet and how many portions can have in each food group. Discussed different thing could have that were low calorie for snacks. Discussed that maybe one or two dark covered chocolate nuts could help with need for sweet taste plus has fiber.Patient received handouts and were reviewed on portions, fiber, food groups and reading labels. We went over using our Plate method and patient received plate with portions marked. Patient received bag to carry all materials home.   Discussed the need to get in some activity as the weather warms up like walking.Patient stated that had started Zumba on weekends.   PLAN: Will Call in 3 to 4 weeks. Will slowly decrease portion sizes per starch group and cheese.

## 2015-10-05 NOTE — Patient Instructions (Signed)
Will decrease serving sizes of cheese and starches. Will decrease number of serving per food group. Will increase activity. Will increase exercise.

## 2015-10-10 ENCOUNTER — Ambulatory Visit: Payer: Self-pay

## 2015-10-11 ENCOUNTER — Ambulatory Visit (INDEPENDENT_AMBULATORY_CARE_PROVIDER_SITE_OTHER): Payer: Self-pay | Admitting: Internal Medicine

## 2015-10-11 ENCOUNTER — Encounter: Payer: Self-pay | Admitting: Internal Medicine

## 2015-10-11 VITALS — BP 124/88 | HR 69 | Temp 98.7°F | Ht 62.0 in | Wt 134.2 lb

## 2015-10-11 DIAGNOSIS — E119 Type 2 diabetes mellitus without complications: Secondary | ICD-10-CM

## 2015-10-11 DIAGNOSIS — E785 Hyperlipidemia, unspecified: Secondary | ICD-10-CM

## 2015-10-11 LAB — BASIC METABOLIC PANEL WITH GFR
BUN: 9 mg/dL (ref 7–25)
CHLORIDE: 104 mmol/L (ref 98–110)
CO2: 26 mmol/L (ref 20–31)
Calcium: 9.8 mg/dL (ref 8.6–10.2)
Creat: 0.55 mg/dL (ref 0.50–1.10)
GFR, Est African American: >89 mL/min (ref >=60)
GFR, Est Non African American: >89 mL/min (ref >=60)
Glucose, Bld: 66 mg/dL (ref 65–99)
POTASSIUM: 3.9 mmol/L (ref 3.5–5.3)
SODIUM: 141 mmol/L (ref 135–146)

## 2015-10-11 LAB — POCT GLYCOSYLATED HEMOGLOBIN (HGB A1C): HEMOGLOBIN A1C: 6.8

## 2015-10-11 LAB — CBC
HCT: 42.3 % (ref 36.0–46.0)
HEMOGLOBIN: 14.8 g/dL (ref 12.0–15.0)
MCH: 29.5 pg (ref 26.0–34.0)
MCHC: 35 g/dL (ref 30.0–36.0)
MCV: 84.3 fL (ref 78.0–100.0)
MPV: 10.6 fL (ref 8.6–12.4)
PLATELETS: 236 10*3/uL (ref 150–400)
RBC: 5.02 MIL/uL (ref 3.87–5.11)
RDW: 14 % (ref 11.5–15.5)
WBC: 7.6 10*3/uL (ref 4.0–10.5)

## 2015-10-11 MED ORDER — PRAVASTATIN SODIUM 40 MG PO TABS: 40.0000 mg | ORAL_TABLET | Freq: Every day | ORAL | Status: DC

## 2015-10-11 MED ORDER — ASPIRIN 81 MG PO TABS: 81.0000 mg | ORAL_TABLET | Freq: Every day | ORAL | Status: DC

## 2015-10-11 MED ORDER — GLIPIZIDE 10 MG PO TABS: 10.0000 mg | ORAL_TABLET | Freq: Every day | ORAL | Status: DC

## 2015-10-11 MED ORDER — METFORMIN HCL 500 MG PO TABS: 1000.0000 mg | ORAL_TABLET | Freq: Two times a day (BID) | ORAL | Status: DC

## 2015-10-11 MED ORDER — GLUCOSE BLOOD VI STRP: ORAL_STRIP | Status: DC

## 2015-10-11 NOTE — Assessment & Plan Note (Signed)
Blood sugars improved. taking metformin and glipizide without any issue. A1c 6.8 today improved from a 7.4 in January. No episodes of hypoglycemia. - will get a CBC and BMET today, as well as a microalbumin, could consider adding low-dose lisinopril for renal protection however will continue to monitor as  blood pressures low during this visit.  - Provided ophthalmology referral, will follow up with patient regarding this - We will see patient in 3 months, consider discussing diet and exercise further.

## 2015-10-11 NOTE — Patient Instructions (Signed)
Provide you with eye referral, they will call you take an appointment.  Please return in 3 months for follow up

## 2015-10-11 NOTE — Assessment & Plan Note (Signed)
No issues on Pravachol 40 mg - refill this med

## 2015-10-11 NOTE — Progress Notes (Signed)
   [facility] Heidi CharsAsiyah Arad Burston, MD Phone: [phone]  Reason For Visit: Diabetes follow-up  # Diabetes: Patient taking glipizide and metformin without any issues. CBGs have been within 99 - 150. Patient denies any episodes of hypoglycemia including diaphoresis,  Palpitations, feeling jittery. Patient has not had any blood sugars below 70. She would like refills of her medications and her glucose strips. Patient states the information provided for diabetic nutrition was helpful at last visit.  # Hyperlipidemia: No issues with her Pravachol. She would like refills. No chest pain or swelling her legs  Past Medical History Reviewed problem list.  Medications- reviewed and updated No additions to family history Social history- patient is a non-smoker  Objective: BP 124/88 mmHg  Pulse 69  Temp(Src) 98.7 F (37.1 C) (Oral)  Ht 5\' 2"  (1.575 m)  Wt 134 lb 3.2 oz (60.873 kg)  BMI 24.54 kg/m2 Gen: NAD, alert, cooperative with exam CV: RRR, good S1/S2, no murmur,  Resp: CTABL, no wheezes, non-labored Ext: No edema,  Assessment/Plan: Type 2 diabetes mellitus (HCC) Blood sugars improved. taking metformin and glipizide without any issue. A1c 6.8 today improved from a 7.4 in January. No episodes of hypoglycemia. - will get a CBC and BMET today, as well as a microalbumin, could consider adding low-dose lisinopril for renal protection however will continue to monitor as  blood pressures low during this visit.  - Provided ophthalmology referral, will follow up with patient regarding this - We will see patient in 3 months, consider discussing diet and exercise further.  Hyperlipidemia No issues on Pravachol 40 mg - refill this med

## 2015-10-12 LAB — MICROALBUMIN / CREATININE URINE RATIO
CREATININE, URINE: 78 mg/dL (ref 20–320)
Microalb Creat Ratio: 5 mcg/mg creat (ref <30)
Microalb, Ur: 0.4 mg/dL

## 2015-10-19 ENCOUNTER — Encounter: Payer: Self-pay | Admitting: Internal Medicine

## 2015-10-31 ENCOUNTER — Telehealth: Payer: Self-pay | Admitting: Internal Medicine

## 2015-10-31 NOTE — Telephone Encounter (Signed)
Discussed lab results with patient, indicating that patient should return for follow up in 2 months

## 2015-12-07 ENCOUNTER — Telehealth: Payer: Self-pay

## 2015-12-07 NOTE — Telephone Encounter (Signed)
[facility] Patient called per phone today for Health Coaching with interpreter Delorise RoyalsJulie Sowell present. Patient's areas of concern were A1C, HDL, Triglycerides and exercise.  HEALTH COACHING: Patient stated had decreased the amount of sugar and starches she is eating. Patient stated at recent doctor appointment her A1C was 6.8. Patient stated that is exercising by doing Zumba 3 times a week for total 135 minutes to 180. Patient states is felling much better. Discussed the importance of eating or supplementing the Omega 3 oils.  PLAN: Will Call in 3 to 4 weeks. Will continue with the good meal and nutrition changes. Will be doing follow up assessment when call next. Will continue to navigate with renewing patient assistance for will have and stay on medication.

## 2015-12-21 ENCOUNTER — Telehealth: Payer: Self-pay

## 2015-12-21 NOTE — Telephone Encounter (Addendum)
Patient called per Delorise RoyalsJulie Sowell interpreter for final assessment.  ASSESSMENT :This is a follow up Assessment following [facility] . Marland Kitchen.  Medication Status : Patient states is not taking medication for hypertension. Patient stated is taking medication for diabetes and cholesterol.  Blood Pressure, Self-measurement: Patient states has no reason to check Blood pressure.  Nutrition Assessment: Patient stated that eats 1/2 cup or less fruit every day. Patient states she eats 2 servings of vegetables a day. Per Patient does not eats 3 or more ounces of whole grains daily. Patient stated doesn't eat two or more servings of fish weekly. Patient states she does drink more than 36 ounces or 450 calories of beverages with added sugars weekly. Patient stated she does watch her salt intake.   Physical Activity Assessment: Patient stated she does around 105 minutes of moderate activity and 60 minutes vigorous per week.  Smoking Status: Patient stated has never smoked and is not exposed to smoke.  Quality of Life Assessment: In assessing patient's Physical quality of life she stated that out of the past 30 days that she has felt her health is good all of them. Patient also stated that in the past 30 days that her mental health was good including stress, depression and problems with emotions for all days. Patient did state that out of the past 30 days she felt her physical or mental health had not kept her from doing her usual activities including self-care, work or recreation.   PLAN: Informed patient that would re screen if does not have health insurance when time and [facility]  TIME SPENT with PATIENT: 15 minutes

## 2016-02-08 ENCOUNTER — Encounter: Payer: Self-pay | Admitting: Internal Medicine

## 2016-02-08 ENCOUNTER — Ambulatory Visit (INDEPENDENT_AMBULATORY_CARE_PROVIDER_SITE_OTHER): Payer: Self-pay | Admitting: Internal Medicine

## 2016-02-08 VITALS — BP 107/66 | HR 70 | Temp 98.6°F | Ht 62.0 in | Wt 130.0 lb

## 2016-02-08 DIAGNOSIS — Z23 Encounter for immunization: Secondary | ICD-10-CM

## 2016-02-08 DIAGNOSIS — E119 Type 2 diabetes mellitus without complications: Secondary | ICD-10-CM

## 2016-02-08 DIAGNOSIS — E785 Hyperlipidemia, unspecified: Secondary | ICD-10-CM

## 2016-02-08 LAB — BASIC METABOLIC PANEL WITH GFR
BUN: 8 mg/dL (ref 7–25)
CHLORIDE: 106 mmol/L (ref 98–110)
CO2: 27 mmol/L (ref 20–31)
CREATININE: 0.63 mg/dL (ref 0.50–1.10)
Calcium: 9.5 mg/dL (ref 8.6–10.2)
GFR, Est African American: >89 mL/min (ref >=60)
Glucose, Bld: 91 mg/dL (ref 65–99)
POTASSIUM: 4.2 mmol/L (ref 3.5–5.3)
SODIUM: 142 mmol/L (ref 135–146)

## 2016-02-08 LAB — POCT GLYCOSYLATED HEMOGLOBIN (HGB A1C): Hemoglobin A1C: 6.9

## 2016-02-08 LAB — LIPID PANEL
Cholesterol: 168 mg/dL (ref 125–200)
HDL: 48 mg/dL (ref >=46)
LDL CALC: 93 mg/dL (ref <130)
Total CHOL/HDL Ratio: 3.5 Ratio (ref <=5.0)
Triglycerides: 136 mg/dL (ref <150)
VLDL: 27 mg/dL (ref <30)

## 2016-02-08 MED ORDER — GLIPIZIDE 10 MG PO TABS: ORAL_TABLET | ORAL | Status: DC

## 2016-02-08 MED ORDER — LISINOPRIL 2.5 MG PO TABS: 2.5000 mg | ORAL_TABLET | Freq: Every day | ORAL | Status: DC

## 2016-02-08 MED ORDER — METFORMIN HCL 500 MG PO TABS: 1000.0000 mg | ORAL_TABLET | Freq: Two times a day (BID) | ORAL | Status: DC

## 2016-02-08 NOTE — Assessment & Plan Note (Addendum)
A1C 6.9, CBGs 120-170 - Per patient she has been taking btw 1000-2000 mg of metformin and 10-20 of glipizide. She states she changes metformin based on carbohydrate intake. Discussed modifying this to taking 2000 mg of metformin and 10-20 mg of glipizide based on carbohydrate intake. She agreed to try this method instead of how she was taking the medication on her own  - Microablumin 0.4 in March, will try lisinopril 2.5 mg for renal protection as blood pressure low already; Check BMET and follow up with lab in 1 week once on lisinopril - Has not seen opthalmology yet, referral was placed in March. Discussed importance of this with patient - Diabetic foot exam negative  - Provided pneumococcal vaccine due to diabetes  - Follow up in 6 months

## 2016-02-08 NOTE — Progress Notes (Signed)
   [facility] Noralee CharsAsiyah Edy Belt, MD Phone: [phone]  Reason For Visit: Diabetes and HLD F/U  # Diabetes  - Medication: Not taking as prescribed; Glipizide 10 mg (once in the AM/ once at lunch time)  and metformin (takes between 1000 and 2000 mg based on the amount of carbohydrates patient takes) - Check CBGs once daily with CBGs 120-170s - Hypoglycemia: No blood sugars below 70, no palpations, no jittery, no diaphoresis  - Denies polyuria or polydipsia  - Taking 81 of aspirin   # Hyperlipidemia  - Taking pravachol 40 mg  - Last lipid panel in January 2017 - No issues, no chest pain, palpations, no dizziness   Past Medical History Reviewed problem list.  Medications- reviewed and updated Social history- patient is a non-smoker  Objective: BP 107/66 mmHg  Pulse 70  Temp(Src) 98.6 F (37 C) (Oral)  Ht 5\' 2"  (1.575 m)  Wt 130 lb (58.968 kg)  BMI 23.77 kg/m2 Gen: NAD, alert, cooperative with exam CV: RRR, good S1/S2, no murmur, 2+ radial pulses  Resp: CTABL, no wheezes, non-labored Abd: SNTND, BS present, no guarding or organomegaly Diabetic Foot Exam - Simple   Simple Foot Form  Diabetic Foot exam was performed with the following findings:  Yes 02/08/2016  9:32 AM  Visual Inspection  No deformities, no ulcerations, no other skin breakdown bilaterally:  Yes  Sensation Testing  Intact to touch and monofilament testing bilaterally:  Yes  Pulse Check  Posterior Tibialis and Dorsalis pulse intact bilaterally:  Yes  Comments      Assessment/Plan: See problem based a/p  Type 2 diabetes mellitus (HCC) A1C 6.9, CBGs 120-170 - Per patient she has been taking btw 1000-2000 mg of metformin and 10-20 of glipizide. She states she changes metformin based on carbohydrate intake. Discussed modifying this to taking 2000 mg of metformin and 10-20 mg of glipizide based on carbohydrate intake. She agreed to try this method instead of how she was taking the medication  on her own  - Microablumin 0.4 in March, will try lisinopril 2.5 mg for renal protection as blood pressure low already; Check BMET and follow up with lab in 1 week once on lisinopril - Has not seen opthalmology yet, referral was placed in March. Discussed importance of this with patient - Diabetic foot exam negative  - Provided pneumococcal vaccine due to diabetes  - Follow up in 6 months   Hyperlipidemia Last lipid panel in January, will recheck  - Continue Pravastatin 40 mg daily  - Follow up in 6 months

## 2016-02-08 NOTE — Assessment & Plan Note (Addendum)
Last lipid panel in January, will recheck  - Continue Pravastatin 40 mg daily  - Follow up in 6 months

## 2016-02-08 NOTE — Patient Instructions (Addendum)
Diabetes Your diabetes is well controlled, please try to see eye doctor Your goal is to have an A1c < 8.0   Come back to see us in: 1 week for a lab visit to check BMET  Please follow up in 6 months

## 2016-02-08 NOTE — Addendum Note (Signed)
Addended by: Garen GramsBENTON, ASHA F on: 02/08/2016 12:23 PM   Modules accepted: Orders

## 2016-02-14 ENCOUNTER — Other Ambulatory Visit: Payer: Self-pay

## 2016-02-14 DIAGNOSIS — E119 Type 2 diabetes mellitus without complications: Secondary | ICD-10-CM

## 2016-02-14 LAB — BASIC METABOLIC PANEL
BUN: 6 mg/dL — AB (ref 7–25)
CALCIUM: 9.3 mg/dL (ref 8.6–10.2)
CHLORIDE: 104 mmol/L (ref 98–110)
CO2: 26 mmol/L (ref 20–31)
CREATININE: 0.59 mg/dL (ref 0.50–1.10)
Glucose, Bld: 119 mg/dL — ABNORMAL HIGH (ref 65–99)
Potassium: 4.2 mmol/L (ref 3.5–5.3)
Sodium: 140 mmol/L (ref 135–146)

## 2016-02-22 ENCOUNTER — Ambulatory Visit (INDEPENDENT_AMBULATORY_CARE_PROVIDER_SITE_OTHER): Payer: Self-pay | Admitting: Internal Medicine

## 2016-02-22 VITALS — BP 105/68 | HR 82 | Temp 98.7°F | Wt 129.4 lb

## 2016-02-22 DIAGNOSIS — E119 Type 2 diabetes mellitus without complications: Secondary | ICD-10-CM

## 2016-02-22 MED ORDER — GLIPIZIDE 5 MG PO TABS: ORAL_TABLET | ORAL | Status: DC

## 2016-02-22 MED ORDER — LISINOPRIL 2.5 MG PO TABS: 2.5000 mg | ORAL_TABLET | Freq: Every day | ORAL | Status: DC

## 2016-02-22 NOTE — Patient Instructions (Addendum)
I want decrease your glipizide from 10 mg twice a day to 5 mg twice daily. I want you continue metformin twice daily as discussed. Follow up in 3 months, if you have hypoglycemic episodes follow up sooner.

## 2016-02-22 NOTE — Progress Notes (Signed)
   [facility] Noralee CharsAsiyah Houa Ackert, MD Phone: [phone]  Reason For Visit: F/U for diabetes   # Diabetes follow up  - At last visit patient was started on lisinopril for renal protection due slightly elevated microabuminuria. Patient denies any problems with this medication. Denies any  dizziness or presyncope. - Recently changed diabetes regiment as patient was taking metformin differently than prescribed. Now patient is taking metformin twice daily as prescribed. She is also taking Glipizide 10 mg in the AM and at lunch time.  - AM CBGs: on Avg blood sugars around 120s  - CBGs 111-157   - However patient indicates she has now had 1 episode of hypoglycemia, CBG 59-69 per patient  (occured about 15 days).   Past Medical History Reviewed problem list.  Medications- reviewed and updated No additions to family history Social history- patient is a non-smoker  Objective: BP 105/68 mmHg  Pulse 82  Temp(Src) 98.7 F (37.1 C) (Oral)  Wt 129 lb 6.4 oz (58.695 kg)  SpO2 98% Gen: NAD, alert, cooperative with exam CV: RRR, good S1/S2, no murmur, 2+ radial pulses  Resp: CTABL, no wheezes, non-labored Ext: No edema, warm, normal tone, moves UE/LE spontaneously   Assessment/Plan: See problem based a/p Type 2 diabetes mellitus (HCC) Regiment: Patient is now taking metformin as instructed at 2000 mg, has now had an episode of hypoglycemia x 1.Therefore indicated that patient should decrease her glipizide by half. She has been instructed to take 5 mg of glipizide at breakfast and 5 at lunch.  - Continue lisinopril 2.5 mg for renal protection  - Patient is to follow up with me within a week if she has anymore episodes of hypoglycemia (blood sugars less than 70), otherwise can follow up in 3 months.

## 2016-02-22 NOTE — Assessment & Plan Note (Addendum)
Regiment: Patient is now taking metformin as instructed at 2000 mg, has now had an episode of hypoglycemia x 1.Therefore indicated that patient should decrease her glipizide by half. She has been instructed to take 5 mg of glipizide at breakfast and 5 at lunch.  - Continue lisinopril 2.5 mg for renal protection  - Patient is to follow up with me within a week if she has anymore episodes of hypoglycemia (blood sugars less than 70), otherwise can follow up in 3 months.  - Discussed with Dr. Perley JainMcDiarmid

## 2016-04-17 ENCOUNTER — Ambulatory Visit: Payer: Self-pay | Attending: Internal Medicine

## 2016-04-17 ENCOUNTER — Ambulatory Visit: Payer: Self-pay

## 2016-04-28 NOTE — Progress Notes (Signed)
Technical error 

## 2016-06-23 ENCOUNTER — Ambulatory Visit (INDEPENDENT_AMBULATORY_CARE_PROVIDER_SITE_OTHER): Payer: Self-pay | Admitting: Internal Medicine

## 2016-06-23 ENCOUNTER — Encounter: Payer: Self-pay | Admitting: Internal Medicine

## 2016-06-23 VITALS — BP 113/74 | HR 73 | Temp 97.8°F | Wt 128.4 lb

## 2016-06-23 DIAGNOSIS — E119 Type 2 diabetes mellitus without complications: Secondary | ICD-10-CM

## 2016-06-23 LAB — POCT GLYCOSYLATED HEMOGLOBIN (HGB A1C): HEMOGLOBIN A1C: 6.3

## 2016-06-23 MED ORDER — GLIPIZIDE 5 MG PO TABS: 2.5000 mg | ORAL_TABLET | Freq: Two times a day (BID) | ORAL | 3 refills | Status: DC

## 2016-06-23 MED ORDER — LISINOPRIL 2.5 MG PO TABS: 2.5000 mg | ORAL_TABLET | Freq: Every day | ORAL | 4 refills | Status: DC

## 2016-06-23 NOTE — Patient Instructions (Addendum)
Quiero que disminuya su glipizida en 2.5 mg o [place]. Si contina teniendo niveles de azcar en la sangre por debajo de 72, quera detener la glipizida de la maana por completo. Quiero que me haga un seguimiento en 1 mes para volver a Scientist, water quality de azcar en la sangre, si contina teniendo bajos niveles de azcar en la sangre durante las prximas 4 semanas, quiero que me haga un seguimiento ms pronto.   Hipoglucemia (Hypoglycemia) La hipoglucemia se produce cuando el nivel de azcar (glucosa) en la sangre es demasiado bajo. Los sntomas de la glucemia baja pueden incluir los siguientes:  Sentir que tiene lo siguiente:  Apetito.  Preocupacin o nervios (ansioso).  Sudoracin y piel hmeda.  Confusin.  Mareos.  Somnolencia.  Nuseas.  Tener lo siguiente:  Latidos cardacos acelerados.  Dolor de [place].  Cambios en la visin.  Una crisis de movimientos que no puede controlar (convulsiones).  Pesadillas.  Hormigueo y falta de sensibilidad (adormecimiento) alrededor de la boca, los labios o la [place].  Dificultades para hacer lo siguiente:  Hablar.  Prestar atencin (concentrarse).  Moverse (coordinacin).  Dormir.  Temblores.  Desmayos.  Molestarse con facilidad (irritabilidad). Las personas que tienen diabetes y las que no tienen la enfermedad pueden tener la glucemia baja. El nivel bajo de glucemia en la sangre puede ocurrir rpidamente y ser Engineer, maintenance (IT). Tratamiento de la glucemia baja  Generalmente, el tratamiento de la glucemia baja consiste en ingerir de inmediato un alimento o una bebida que contengan azcar. Si puede pensar con claridad y tragar de manera segura, siga la regla 15/15, que consiste en lo siguiente:  Consumir 15gramos de un hidrato de carbono de accin rpida. Algunos hidratos de carbono de accin rpida son los siguientes:  1pomo de glucosa en gel.  3comprimidos de azcar (comprimidos de  glucosa).  6 a 8unidades de caramelos duros.  4onzas (162m) de jugo de frutas.  4onzas (1263m de gaseosa comn (no diettica).  Contrlese la glucemia 1528mtos despus de ingerir el hidrato de carbono.  Si el nivel de glucosa en la sangre todava es igual o menor que 45m6m (3,9mmo38m), ingiera nuevamente 15gramos de un hidrato de carbono.  Si el nivel de glucosa en la sangre no supera los 45mg/23m3,9mmol/65mdespus de 3intentos, solicite ayuda de inmediato.  Ingiera una comida o una colacin en el transcurso de 1hora despus de que la glucemia se haya normalizado. Tratamiento de la glucemia muy baja  Si el nivel de glucosa en la sangre es igual o menor que 54mg/dl22mmol/l)52mignifica que est muy bajo (hipoglucemia grave). Esto es una emergEngineer, maintenance (IT)re hasta que los sntomas desaparezcan. Solicite atencin mdica de inmediato. Comunquese con el servicio de emergencias de su localidad (911 en los [place]). No conduzca por sus propios medios hasta el Principal Financialivel de glucosa en la sangre muy bajo y no puede ingerir ningn alimento ni bebida, tal vez deba aplicarse una inyeccin de glucagn. Un familiar o un amigo deben aprender a controlarle la glucemia y a aplicarle una inyeccin de glucagn. Pregntele al mdico si debe tener un kit de inyecciones de glucagn en su casa. CUIDADOS EN EL HOGAR Instrucciones generales   Evite cualquier dieta que le impida ingerir la cantidad suficiente de comida. Hable con el mdico antes de comenzar una dieta nueva.  Tome los medicamentos de venta libre y los recetados solamente como se lo haya indicado el mdico.  Limite el consumo de alcohol  a no ms de 30mdida por da si es mujer y no est embarazada y a 267midas por da si es hombre. Una medida equivale a 12onzas de cerveza, 5onzas de vino o 1onzas de bebidas alcohlicas de alta graduacin.  Concurra a todas las visitas de control como se lo haya indicado  el mdico. Esto es importante. Si usted tiene diabetes:   Asegrese de coAssurante la hipoglucemia.  Siempre tenga a mano una fuente de azcar, como por ejemplo:  Azcar.  Comprimidos de azcar.  Gel de glucosa.  Jugo de frutas.  Gaseosa comn (gaseosa que no sea diettica).  Leche.  Caramelos duros.  Miel.  Tome los medicamentos segn las indicaciones.  Siga el plan de ejercicios y de alimentacin.  Coma a horario. No omita comidas.  Siga el plan para los das de enfermedad cuando no pueda comer o beber normalmente. Arme este plan de antemano con el mdico.  Contrlese la glucemia con la frecuencia que le haya indicado el mdico. Contrlesela siempre antes y despus de hacer actividad fsica.  Comparta su plan de atencin de la diabetes con estas personas:  Compaeros de trabajo o de la escuela.  Aquellas con las que coBiwabik Hgase anlisis de orina para deProduct managerresencia de cetonas:  Cuando est enfermo.  Como se lo haya indicado el mdico.  Lleve consigo una tarjeta, o use un brazalete o una medalla que indiquen que es diabtico. Si tiene un nivel bajo de glucosa en la sangre debido a otras causas:   Contrlese la glucemia con la frecuencia que le haya indicado el mdico.  Siga las indicaciones del mdico respecto de lo que no puede comer o beber. SOLICITE AYUDA SI:  Tiene problemas para mantener el nivel de glucosa en la sangre dentro del rango indicado.  Tiene la glucemia baja con frecuencia. SOLICITE AYUDA DE INMEDIATO SI:  Contina teniendo sntomas despus de haber comido o ingerido algo con azcar.  La glucemia es igual o inferior a 5452ml (3mm46mdl).  Tiene una crisis de movimientos que no puedIT consultante desmaya. Estos sntomas pueden indiSales executive espere hasta que los sntomas desaparezcan. Solicite atencin mdica de inmediato. Comunquese con el servicio de emergencias de su localidad (911 en los  [place]). No conduzca por sus propios medios hastPrincipal Financialsta informacin no tiene comoMarine scientistconsejo del mdico. Asegrese de hacerle al mdico cualquier pregunta que tenga. Document Released: 08/23/2010 Document Revised: 11/12/2015 Document Reviewed: 08/24/2015 Elsevier Interactive Patient Education  2017 [employer]

## 2016-06-23 NOTE — Assessment & Plan Note (Addendum)
A1C 6.3, Reports 4 episodes of hypoglycemia over the past 4 months. - Medication management -will decrease glipizide from 5 mg twice a day 2.5 mg twice a day, if patient has another hypoglycemic episode instructed to stop morning dose of glipizide. Continue metformin 1000 mg BID.  - Please follow up in 1 month or sooner if episodes of hypoglycemia - Currently on statin, ACE, aspirin - Reviewed criteria for hypoglycemia - Continue lifestyle modifications.  - Referral to opthalmology has not seen an eye doctor

## 2016-06-23 NOTE — Progress Notes (Signed)
   [facility] Noralee CharsAsiyah Jeena Arnett, MD Phone: [phone]  Reason For Visit: Follow up T2DM   # CHRONIC DM, Type 2: Reports no concerns, most recently seen in July; decreased glipizide from 10 mg to 5 mg BID, with follow up in 3 months.   CBGs: Avg 105-110 , Low , High 145-150. Checks CBGs  Meds: Metformin [phone] mg BID depending on how much she eats. Glipizide 5 mg daily with breakfast and lunch Reports: good compliance. Tolerating well w/o side-effects Currently on ACEi / ARB Lifestyle: Diet, patient has been decreasing her carb intake Any hypoglycemia episodes: 4-5 times with hypoglycemic episodes since seeing me last. Patient feels diaphoretic with episodes, Denies palpations  Denies polyuria, visual changes, numbness or tingling.  Past Medical History Reviewed problem list.  Medications- reviewed and updated No additions to family history Social history- patient is a non-smoker  Objective: BP 113/74 (BP Location: Left Arm, Patient Position: Sitting, Cuff Size: Normal)   Pulse 73   Temp 97.8 F (36.6 C) (Oral)   Wt 128 lb 6.4 oz (58.2 kg)   BMI 23.48 kg/m  Gen: NAD, alert, cooperative with exam Cardio: regular rate and rhythm, S1S2 heard, no murmurs appreciated Skin: dry, intact, no rashes or lesions Neuro: Strength and sensation grossly intact  Assessment/Plan: See problem based a/p  Type 2 diabetes mellitus (HCC) A1C 6.3, Reports 4 episodes of hypoglycemia over the past 4 months. - Medication management -will decrease glipizide from 5 mg twice a day 2.5 mg twice a day, if patient has another hypoglycemic episode instructed to stop morning dose of glipizide. Continue metformin 1000 mg BID.  - Please follow up in 1 month or sooner if episodes of hypoglycemia - Currently on statin, ACE, aspirin - Reviewed criteria for hypoglycemia - Continue lifestyle modifications.  - Referral to opthalmology has not seen an eye doctor

## 2016-06-25 ENCOUNTER — Telehealth: Payer: Self-pay | Admitting: *Deleted

## 2016-06-25 DIAGNOSIS — E119 Type 2 diabetes mellitus without complications: Secondary | ICD-10-CM

## 2016-06-25 NOTE — Telephone Encounter (Signed)
Directions on recent rx for glipizide are conflicting. Pharmacist is cancelling rx and would like MD to send new rx with correct instructions.

## 2016-07-01 MED ORDER — GLIPIZIDE 5 MG PO TABS: 2.5000 mg | ORAL_TABLET | Freq: Two times a day (BID) | ORAL | 3 refills | Status: DC

## 2016-07-24 ENCOUNTER — Ambulatory Visit (INDEPENDENT_AMBULATORY_CARE_PROVIDER_SITE_OTHER): Payer: Self-pay | Admitting: Internal Medicine

## 2016-07-24 DIAGNOSIS — E119 Type 2 diabetes mellitus without complications: Secondary | ICD-10-CM

## 2016-07-24 DIAGNOSIS — E785 Hyperlipidemia, unspecified: Secondary | ICD-10-CM

## 2016-07-24 NOTE — Progress Notes (Signed)
   [facility] Heidi Gaineroralee CharsAsiyah Necola Bluestein, MD Phone: [phone]  Reason For Visit:  Follow up Diabetes and HLD    CHRONIC DM, Type 2: Last time patient was here we decreased glipizide to 2.5 mg BID daily.  CBGs: Avg 120, Low 86, High 150. Checks CBGs once daily  Meds:Glipizide 2.5 mg BID, Metformin 1000 mg twice daily  Reports  good compliance. Tolerating well w/o side-effects Currently on ACEi, no dizziness or  presyncope per patient  Diet: decreased sugar intake and has decreasing carbohydrates as well  Any hypoglycemia episodes: Patient indicates have symptoms of feeling jittery at times, she however has checked her blood sugars and they are within normal limits. No palpations, weakness, diaphoresis  Denies polyuria, visual changes, numbness or tingling. Still has not seen opthalmology, though has received a referral   HLD - taking statin, no muscle aches, no issues with medication   Past Medical History Reviewed problem list.  Medications- reviewed and updated No additions to family history Social history- patient is a non-smoker  Objective: BP 90/60 (BP Location: Left Arm, Patient Position: Sitting, Cuff Size: Normal)   Pulse 70   Temp 98.1 F (36.7 C) (Oral)   Ht 5\' 2"  (1.575 m)   Wt 126 lb (57.2 kg)   SpO2 99%   BMI 23.05 kg/m  Gen: NAD, alert, cooperative with exam Cardio: regular rate and rhythm, S1S2 heard, no murmurs appreciated Skin: dry, intact, no rashes or lesions  Assessment/Plan: See problem based a/p  Type 2 diabetes mellitus (HCC) Controlled, Last A1C 6.3 in 11/17. Decreased Glipizide to 2.5 mg   - Continue glipizide 2.5 mg BID  - Continue metformin 1000 mg BID  - Lisinopril 2.5 mg for renal protection  - Flu shot received a church previously  -[place]  Per discussion with Tia difficult to find ophthalmologist to see patient currently, though referral in place  - Follow up in February   Hyperlipidemia Lipid panel- LDL 93, total  cholesterol 168  - continue pravastatin 40 mg daily  - Will check lipid panel in february

## 2016-07-24 NOTE — Patient Instructions (Signed)
You are doing great. Continue your glipizide at 2.5 mg twice daily and metformin 1000 mg twice daily. I am not sure when we will be able to get you in with the opthalmologist/eye doctor. Here are a couple of doctors you could try to contact.   Exie ParodyMark T Shapiro MD  1.0 (1)  [place]hthalmologist  [place]  [phone]  Beulah GandyJohn D. Ashley RoyaltyMatthews, MD - [facility]  5.0 (1)  Ophthalmologist  [place]  [phone]  [facility]  4.3 (10)  Ophthalmologist  [place], [place]  [phone]   Allyne GeeSanders, M.D., [facility]  5.0 (1)  Doctor  [place], [place]  [phone]) [phone]   [facility]: [place] [place] Kathryn J MD  3.3 (3)  Ophthalmologist  [place]  [phone]   [facility]  2.9 [phone]  Ophthalmologist  [place]  [phone]  Closed now

## 2016-07-27 NOTE — Assessment & Plan Note (Signed)
Lipid panel- LDL 93, total cholesterol 161168  - continue pravastatin 40 mg daily  - Will check lipid panel in february

## 2016-07-27 NOTE — Assessment & Plan Note (Addendum)
Controlled, Last A1C 6.3 in 11/17. Decreased Glipizide to 2.5 mg   - Continue glipizide 2.5 mg BID  - Continue metformin 1000 mg BID  - Lisinopril 2.5 mg for renal protection  - Flu shot received a church previously  -[place]  Per discussion with Tia difficult to find ophthalmologist to see patient currently, though referral in place  - Follow up in February

## 2016-09-26 ENCOUNTER — Encounter: Payer: Self-pay | Admitting: Internal Medicine

## 2016-09-26 ENCOUNTER — Ambulatory Visit (INDEPENDENT_AMBULATORY_CARE_PROVIDER_SITE_OTHER): Payer: Self-pay | Admitting: Internal Medicine

## 2016-09-26 VITALS — BP 108/68 | HR 67 | Temp 98.4°F | Ht 62.0 in | Wt 127.0 lb

## 2016-09-26 DIAGNOSIS — E785 Hyperlipidemia, unspecified: Secondary | ICD-10-CM

## 2016-09-26 DIAGNOSIS — E119 Type 2 diabetes mellitus without complications: Secondary | ICD-10-CM

## 2016-09-26 LAB — POCT GLYCOSYLATED HEMOGLOBIN (HGB A1C): HEMOGLOBIN A1C: 6.4

## 2016-09-26 MED ORDER — GLIPIZIDE 5 MG PO TABS: ORAL_TABLET | ORAL | 3 refills | Status: DC

## 2016-09-26 MED ORDER — METFORMIN HCL 500 MG PO TABS: 1000.0000 mg | ORAL_TABLET | Freq: Two times a day (BID) | ORAL | 6 refills | Status: DC

## 2016-09-26 NOTE — Progress Notes (Signed)
   [facility] Heidi CharsAsiyah Lakhia Gengler, MD Phone: [phone]  Reason For Visit: F/U T2DM  # CHRONIC DM, Type 2: Reports taking  Takes Glipizide 2.5 mg in AM, Glipizide 2.5 mg at lunch, and 5 mg Glipizide at night . Patient was suppose to take 2.5 mg of glipizide in AM and PM, provided teach back at last visit. However patient indicating having 3-4  blood sugars in the afternoon that were elevated and therefore has been taking 5 mg glipizide at night  CBGs: CBGs in AM - 120s, sometimes checks blood sugars in the afternoon  Meds:Metformin 1000 mg BID, Reports good compliance. Tolerating well w/o side-effects Currently on ACEi / ARB  Any hypoglycemia episodes: Denies palpations, diaphoresis, fatigue, weakness, jittery Denies polyuria, visual changes, numbness or tingling.  HLD  - No issues with statin  - Denies CP, dyspnea,  SOB    Past Medical History Reviewed problem list.  Medications- reviewed and updated No additions to family history Social history- patient is a non- smoker  Objective: BP 108/68   Pulse 67   Temp 98.4 F (36.9 C) (Oral)   Ht 5\' 2"  (1.575 m)   Wt 127 lb (57.6 kg)   SpO2 98%   BMI 23.23 kg/m  Gen: NAD, alert, cooperative with exam Cardio: regular rate and rhythm, S1S2 heard, no murmurs appreciated Pulm: clear to auscultation bilaterally, no wheezes, rhonchi or rales Skin: dry, intact, no rashes or lesions   Assessment/Plan: See problem based a/p  Hyperlipidemia Continue statin, no issues with medication   Type 2 diabetes mellitus (HCC) A1C 6.4 today. Denies any hypoglycemic episodes, previously discussed decreasing glipizide, however patient increased dose herself due to a elevated CBGs in the afternoon after lunch. As patient largest meal of the day is in the afternoon, CBGs often elevated at 200, patient has been taking 5 mg at night. Previously was on 5 mg in the AM and would have hypoglycemic episodes in the late morning. Denies  any hypoglycemic episodes. Decide with patient to change glipizide to 2.5 mg in the AM, 5 mg glipizide at lunch times (as this is patient's biggest meal) and 2.5 mg at night.  Continue metformin 1000 mg BID  Follow up in 2 months

## 2016-09-26 NOTE — Patient Instructions (Addendum)
Please continue taking your glizipide as discussed 2.5  Mg in the AM, 5 Mg at lunch time, and 2.5 mg at dinner. Continue metformin 1000 mg in the morning and at night. Please follow up in 1-2 months

## 2016-09-30 NOTE — Assessment & Plan Note (Signed)
Continue statin, no issues with medication

## 2016-09-30 NOTE — Assessment & Plan Note (Signed)
A1C 6.4 today. Denies any hypoglycemic episodes, previously discussed decreasing glipizide, however patient increased dose herself due to a elevated CBGs in the afternoon after lunch. As patient largest meal of the day is in the afternoon, CBGs often elevated at 200, patient has been taking 5 mg at night. Previously was on 5 mg in the AM and would have hypoglycemic episodes in the late morning. Denies any hypoglycemic episodes. Decide with patient to change glipizide to 2.5 mg in the AM, 5 mg glipizide at lunch times (as this is patient's biggest meal) and 2.5 mg at night.  Continue metformin 1000 mg BID  Follow up in 2 months

## 2016-12-25 ENCOUNTER — Other Ambulatory Visit: Payer: Self-pay | Admitting: Internal Medicine

## 2016-12-26 ENCOUNTER — Other Ambulatory Visit: Payer: Self-pay | Admitting: *Deleted

## 2016-12-26 MED ORDER — PRAVASTATIN SODIUM 40 MG PO TABS: 40.0000 mg | ORAL_TABLET | Freq: Every day | ORAL | 6 refills | Status: DC

## 2017-02-13 ENCOUNTER — Ambulatory Visit: Payer: Self-pay

## 2017-02-13 ENCOUNTER — Other Ambulatory Visit: Payer: Self-pay | Admitting: Internal Medicine

## 2017-02-13 DIAGNOSIS — E119 Type 2 diabetes mellitus without complications: Secondary | ICD-10-CM

## 2017-02-13 NOTE — Telephone Encounter (Signed)
Patient needs refill on all meds per spanish interpreter. Please let patient know when she may  Pick up. [phone]

## 2017-02-16 ENCOUNTER — Encounter (HOSPITAL_COMMUNITY): Payer: Self-pay | Admitting: *Deleted

## 2017-02-16 MED ORDER — GLUCOSE BLOOD VI STRP: ORAL_STRIP | 25 refills | Status: DC

## 2017-02-16 MED ORDER — GLIPIZIDE 5 MG PO TABS: ORAL_TABLET | ORAL | 3 refills | Status: DC

## 2017-02-16 MED ORDER — METFORMIN HCL 500 MG PO TABS: 1000.0000 mg | ORAL_TABLET | Freq: Two times a day (BID) | ORAL | 6 refills | Status: DC

## 2017-02-16 MED ORDER — ASPIRIN 81 MG PO TABS: 81.0000 mg | ORAL_TABLET | Freq: Every day | ORAL | 4 refills | Status: AC

## 2017-02-16 MED ORDER — LISINOPRIL 2.5 MG PO TABS: 2.5000 mg | ORAL_TABLET | Freq: Every day | ORAL | 4 refills | Status: DC

## 2017-02-16 MED ORDER — PRAVASTATIN SODIUM 40 MG PO TABS: 40.0000 mg | ORAL_TABLET | Freq: Every day | ORAL | 6 refills | Status: DC

## 2017-05-12 ENCOUNTER — Encounter (HOSPITAL_COMMUNITY): Payer: Self-pay

## 2017-06-15 ENCOUNTER — Ambulatory Visit: Payer: Self-pay | Attending: Nurse Practitioner

## 2017-06-22 ENCOUNTER — Encounter: Payer: Self-pay | Admitting: Nurse Practitioner

## 2017-06-22 ENCOUNTER — Ambulatory Visit: Payer: Self-pay | Attending: Nurse Practitioner | Admitting: Nurse Practitioner

## 2017-06-22 VITALS — BP 109/77 | HR 84 | Temp 98.7°F | Resp 18 | Ht 61.5 in | Wt 130.2 lb

## 2017-06-22 DIAGNOSIS — Z7984 Long term (current) use of oral hypoglycemic drugs: Secondary | ICD-10-CM | POA: Insufficient documentation

## 2017-06-22 DIAGNOSIS — Z79899 Other long term (current) drug therapy: Secondary | ICD-10-CM | POA: Insufficient documentation

## 2017-06-22 DIAGNOSIS — Z0001 Encounter for general adult medical examination with abnormal findings: Secondary | ICD-10-CM | POA: Insufficient documentation

## 2017-06-22 DIAGNOSIS — E1169 Type 2 diabetes mellitus with other specified complication: Secondary | ICD-10-CM

## 2017-06-22 DIAGNOSIS — E119 Type 2 diabetes mellitus without complications: Secondary | ICD-10-CM

## 2017-06-22 DIAGNOSIS — Z7982 Long term (current) use of aspirin: Secondary | ICD-10-CM | POA: Insufficient documentation

## 2017-06-22 DIAGNOSIS — E785 Hyperlipidemia, unspecified: Secondary | ICD-10-CM

## 2017-06-22 DIAGNOSIS — Z23 Encounter for immunization: Secondary | ICD-10-CM

## 2017-06-22 DIAGNOSIS — M542 Cervicalgia: Secondary | ICD-10-CM

## 2017-06-22 LAB — GLUCOSE, POCT (MANUAL RESULT ENTRY): POC Glucose: 92 mg/dl (ref 70–99)

## 2017-06-22 LAB — POCT GLYCOSYLATED HEMOGLOBIN (HGB A1C): Hemoglobin A1C: 6.6

## 2017-06-22 MED ORDER — GLIPIZIDE 5 MG PO TABS: ORAL_TABLET | ORAL | 3 refills | Status: DC

## 2017-06-22 MED ORDER — PRAVASTATIN SODIUM 40 MG PO TABS: 40.0000 mg | ORAL_TABLET | Freq: Every day | ORAL | 0 refills | Status: DC

## 2017-06-22 MED ORDER — METFORMIN HCL 500 MG PO TABS: 1000.0000 mg | ORAL_TABLET | Freq: Two times a day (BID) | ORAL | 2 refills | Status: DC

## 2017-06-22 MED ORDER — TRUE METRIX METER W/DEVICE KIT: PACK | 0 refills | Status: DC

## 2017-06-22 MED ORDER — TIZANIDINE HCL 4 MG PO TABS: 4.0000 mg | ORAL_TABLET | Freq: Four times a day (QID) | ORAL | 0 refills | Status: AC | PRN

## 2017-06-22 MED ORDER — TRUEPLUS LANCETS 28G MISC: 3 refills | Status: DC

## 2017-06-22 MED ORDER — GLUCOSE BLOOD VI STRP: ORAL_STRIP | 12 refills | Status: DC

## 2017-06-22 MED FILL — TRUEplus LANCETS 28G MISC: 30 days supply | Qty: 100 | Fill #0

## 2017-06-22 MED FILL — ?METFORMIN HCL 500MG TABLET: 500 | 30 days supply | Qty: 120 | Fill #0

## 2017-06-22 MED FILL — ?GLIPIZIDE 5MG TABLET: 5 | 30 days supply | Qty: 60 | Fill #0

## 2017-06-22 MED FILL — PRAVASTATIN NA 40 MG TAB: 40 | 30 days supply | Qty: 30 | Fill #0

## 2017-06-22 MED FILL — tiZANidine HCL 4 MG TABS: 4 | 10 days supply | Qty: 40 | Fill #0

## 2017-06-22 MED FILL — !TRUE METRIX BLOOD GLUCOSE: 1 days supply | Qty: 1 | Fill #0

## 2017-06-22 MED FILL — TRUE METRIX TEST STRIP: 30 days supply | Qty: 100 | Fill #0

## 2017-06-22 NOTE — Progress Notes (Signed)
Assessment & Plan:  Heidi Shaw was seen today for new patient (initial visit).  Diagnoses and all orders for this visit:  Type 2 diabetes mellitus without complication, without long-term current use of insulin (HCC) -     Glucose (CBG) -     HgB A1c -     Blood Glucose Monitoring Suppl (TRUE METRIX METER) w/Device KIT; Use to check blood sugars once a day. -     glucose blood (TRUE METRIX BLOOD GLUCOSE TEST) test strip; Use as instructed -     TRUEPLUS LANCETS 28G MISC; Use as prescribed -     metFORMIN (GLUCOPHAGE) 500 MG tablet; Take 2 tablets (1,000 mg total) 2 (two) times daily with a meal by mouth. -     glipiZIDE (GLUCOTROL) 5 MG tablet; Take 0.5 tablet at breakfast, 5 mg at lunch, and 2.5 mg at dinner Diabetes blood sugar goals  Aim for 30 minutes of exercise most days. Rethink what you drink. Water is great! Aim for 2-3 Carb Choices per meal (30-45 grams) +/- 1 either way  Aim for 0-15 Carbs per snack if hungry  Include protein in moderation with your meals and snacks  Consider reading food labels for Total Carbohydrate and Fat Grams of foods  Consider checking BG at alternate times per day  Continue taking medication as directed Be mindful about how much sugar you are adding to beverages and other foods. Fruit Punch - find one with no sugar  Measure and decrease portions of carbohydrate foods  Make your plate and don't go back for seconds  Hyperlipidemia associated with type 2 diabetes mellitus (HCC) -     pravastatin (PRAVACHOL) 40 MG tablet; Take 1 tablet (40 mg total) daily by mouth. Exercise at least 150 minutes per week. Work on trying to lose at least 5-10% of weight.   Neck pain, musculoskeletal -     tiZANidine (ZANAFLEX) 4 MG tablet; Take 1 tablet (4 mg total) every 6 (six) hours as needed for up to 10 days by mouth for muscle spasms. May alternate with tylenol and acetaminophen as needed for pain. May alternate heat and ice to the affected area.    Needs  flu shot -     Flu Vaccine QUAD 6+ mos PF IM (Fluarix Quad PF)     Subjective:   Chief Complaint  Patient presents with  . New Patient (Initial Visit)    Patient want to establish care and patient need medication refills.    HPI Heidi Shaw 46 y.o. female presents to office today to establish care. She has history of DM and HPL.   Diabetes Mellitus This is a chronic condition. Checks fasting blood sugars once a day. Reports readings 130-140. She endorses medication compliance with metformin and glipizide. She takes lisinopril for renal protection. Denies any hypoglycemia medication intolerance, numbness or tingling in extremities, GI upset, headaches or nausea.  Lab Results  Component Value Date   HGBA1C 6.6 06/22/2017    Hyperlipidemia She takes pravastatin daily for hyperlipidemia. She endorses medication compliance.  Lab Results  Component Value Date   CHOL 168 02/08/2016   CHOL 204 (H) 08/16/2015   CHOL 196 06/07/2010   Lab Results  Component Value Date   HDL 48 02/08/2016   HDL 37 (L) 08/16/2015   HDL 53 06/07/2010   Lab Results  Component Value Date   LDLCALC 93 02/08/2016   LDLCALC 100 (H) 08/16/2015   LDLCALC 121 (H) 06/07/2010   Lab Results  Component  Value Date   TRIG 136 02/08/2016   TRIG 333 (H) 08/16/2015   TRIG 110 06/07/2010   Lab Results  Component Value Date   CHOLHDL 3.5 02/08/2016   CHOLHDL 5.5 (H) 08/16/2015   CHOLHDL 3.7 Ratio 06/07/2010    Neck Pain: Patient complains of neck pain. Event that precipitate these symptoms: none known. Onset of symptoms 2 months ago, gradually worsening since that time. Aggravating factors: stress. Current symptoms are pain in back of neck (aching and dull in character; 6/10 in severity). Patient denies numbness or tingling. . Patient has had no prior neck problems.  Previous treatments include: medication: Nyquil. .   Past Medical History:  Diagnosis Date  . Anemia   . Diabetes mellitus without  complication ([place])   . Heart murmur   . Hyperlipemia     Past Surgical History:  Procedure Laterality Date  . CESAREAN SECTION      Family History  Problem Relation Age of Onset  . Diabetes Maternal Uncle   . Diabetes Maternal Uncle     Social History   Socioeconomic History  . Marital status: Single    Spouse name: Not on file  . Number of children: 4  . Years of education: Not on file  . Highest education level: Not on file  Social Needs  . Financial resource strain: Not on file  . Food insecurity - worry: Not on file  . Food insecurity - inability: Not on file  . Transportation needs - medical: Not on file  . Transportation needs - non-medical: Not on file  Occupational History  . Occupation: Factory - Pensions consultant   Tobacco Use  . Smoking status: Never Smoker  . Smokeless tobacco: Never Used  Substance and Sexual Activity  . Alcohol use: No  . Drug use: No  . Sexual activity: Yes    Birth control/protection: Implant  Other Topics Concern  . Not on file  Social History Narrative  . Not on file    Outpatient Medications Prior to Visit  Medication Sig Dispense Refill  . aspirin 81 MG tablet Take 1 tablet (81 mg total) by mouth daily. Reported on 08/09/2015 90 tablet 4  . lisinopril (ZESTRIL) 2.5 MG tablet Take 1 tablet (2.5 mg total) by mouth daily. 30 tablet 4  . glipiZIDE (GLUCOTROL) 5 MG tablet Take 0.5 tablet at breakfast, 5 mg at lunch, and 2.5 mg at dinner 60 tablet 3  . glucose blood (WAVESENSE PRESTO) test strip PLEASE TAKE MORNING BLOOD SUGAR AND AS NEEDED FOR SYMPTOMS OF LOW BLOOD SUGAR 50 each 25  . metFORMIN (GLUCOPHAGE) 500 MG tablet Take 2 tablets (1,000 mg total) by mouth 2 (two) times daily with a meal. 120 tablet 6  . pravastatin (PRAVACHOL) 40 MG tablet Take 1 tablet (40 mg total) by mouth daily. 60 tablet 6   No facility-administered medications prior to visit.     No Known Allergies  Review of Systems  Constitutional: Negative for fever,  malaise/fatigue and weight loss.  HENT: Negative.  Negative for nosebleeds.   Eyes: Negative.  Negative for blurred vision, double vision and photophobia.  Respiratory: Negative.  Negative for cough and shortness of breath.   Cardiovascular: Negative.  Negative for chest pain, palpitations and leg swelling.  Gastrointestinal: Negative.  Negative for abdominal pain, constipation, diarrhea, heartburn, nausea and vomiting.  Musculoskeletal: Positive for myalgias and neck pain.  Neurological: Negative.  Negative for dizziness, focal weakness, seizures and headaches.  Endo/Heme/Allergies: Negative for environmental allergies.  Psychiatric/Behavioral:  Negative.  Negative for suicidal ideas.       Objective:    Physical Exam  Constitutional: She is oriented to person, place, and time. She appears well-developed and well-nourished. She is cooperative.  HENT:  Head: Normocephalic and atraumatic.  Eyes: EOM are normal.  Neck: Normal range of motion.  Cardiovascular: Normal rate, regular rhythm, normal heart sounds and intact distal pulses. Exam reveals no gallop and no friction rub.  No murmur heard. Pulmonary/Chest: Effort normal and breath sounds normal. No tachypnea. No respiratory distress. She has no decreased breath sounds. She has no wheezes. She has no rhonchi. She has no rales. She exhibits no tenderness.  Abdominal: Soft. Bowel sounds are normal.  Musculoskeletal: Normal range of motion. She exhibits no edema.       Cervical back: She exhibits pain.       Back:  Neurological: She is alert and oriented to person, place, and time. Coordination normal.  Skin: Skin is warm and dry.  Psychiatric: She has a normal mood and affect. Her behavior is normal. Judgment and thought content normal.  Nursing note and vitals reviewed.   BP 109/77 (BP Location: Left Arm, Patient Position: Sitting, Cuff Size: Normal)   Pulse 84   Temp 98.7 F (37.1 C) (Oral)   Resp 18   Ht 5' 1.5" (1.562 m)    Wt 130 lb 3.2 oz (59.1 kg)   BMI 24.20 kg/m  Wt Readings from Last 3 Encounters:  06/22/17 130 lb 3.2 oz (59.1 kg)  09/26/16 127 lb (57.6 kg)  07/24/16 126 lb (57.2 kg)        Patient has been counseled extensively about nutrition and exercise as well as the importance of adherence with medications and regular follow-up. The patient was given clear instructions to go to ER or return to medical center if symptoms don't improve, worsen or new problems develop. The patient verbalized understanding.   Follow-up: Return in about 3 months (around 09/22/2017) for HPL/DM .   Gildardo Pounds, FNP-BC [facility] and [facility], [place]   06/22/2017, 5:15 PM

## 2017-06-22 NOTE — Progress Notes (Signed)
FLU

## 2017-07-03 ENCOUNTER — Ambulatory Visit: Payer: Self-pay | Attending: Nurse Practitioner

## 2017-07-08 ENCOUNTER — Telehealth: Payer: Self-pay | Admitting: Nurse Practitioner

## 2017-07-08 DIAGNOSIS — E119 Type 2 diabetes mellitus without complications: Secondary | ICD-10-CM

## 2017-07-08 MED ORDER — LISINOPRIL 2.5 MG PO TABS: 2.5000 mg | ORAL_TABLET | Freq: Every day | ORAL | 2 refills | Status: DC

## 2017-07-08 NOTE — Telephone Encounter (Signed)
Refilled lisinopril. The other requested medications have refills at the pharmacy. Reviewed pharmacy records and it is too soon for any of them to be refilled.

## 2017-07-08 NOTE — Telephone Encounter (Signed)
Pt came in to request a refill on  -lisinopril (ZESTRIL) 2.5 MG tablet  -glipiZIDE (GLUCOTROL) 5 MG tablet  -metFORMIN (GLUCOPHAGE) 500 MG  -pravastatin (PRAVACHOL) 40 MG tablet  Please follow up If approved please send to [facility] pharmacy

## 2017-07-08 NOTE — Addendum Note (Signed)
Addended by: Santa LighterHAMMER, Cortlynn Hollinsworth K on: 07/08/2017 04:18 PM   Modules accepted: Orders

## 2017-07-21 MED FILL — ?METFORMIN HCL 500MG TABLET: 500 | 30 days supply | Qty: 120 | Fill #1

## 2017-07-21 MED FILL — ?PRAVASTATIN SODIUM 40MG TA: 40 | 30 days supply | Qty: 30 | Fill #1

## 2017-07-21 MED FILL — ?GLIPIZIDE 5MG TABLET: 5 | 30 days supply | Qty: 60 | Fill #1

## 2017-07-21 MED FILL — LISINOPRIL 2.5 MG TABLET: 2.5 | 30 days supply | Qty: 30 | Fill #0

## 2017-08-24 ENCOUNTER — Telehealth: Payer: Self-pay | Admitting: Nurse Practitioner

## 2017-08-24 DIAGNOSIS — E785 Hyperlipidemia, unspecified: Principal | ICD-10-CM

## 2017-08-24 DIAGNOSIS — E1169 Type 2 diabetes mellitus with other specified complication: Secondary | ICD-10-CM

## 2017-08-24 MED FILL — ?METFORMIN HCL 500MG TABLET: 500 | 30 days supply | Qty: 120 | Fill #2

## 2017-08-24 MED FILL — ?PRAVASTATIN SODIUM 40MG TA: 40 | 30 days supply | Qty: 30 | Fill #2

## 2017-08-24 MED FILL — ?GLIPIZIDE 5MG TABLET: 5 | 30 days supply | Qty: 60 | Fill #2

## 2017-08-24 MED FILL — LISINOPRIL 2.5 MG TABLET: 2.5 | 30 days supply | Qty: 30 | Fill #1

## 2017-08-24 NOTE — Telephone Encounter (Signed)
Pt called to get a refill for pravastatin (PRAVACHOL) 40 MG tablet [facility], please follow up

## 2017-08-25 MED ORDER — PRAVASTATIN SODIUM 40 MG PO TABS: 40.0000 mg | ORAL_TABLET | Freq: Every day | ORAL | 0 refills | Status: DC

## 2017-08-25 NOTE — Telephone Encounter (Signed)
Refilled x 30 days, needs OV with PCP 

## 2017-09-25 ENCOUNTER — Encounter: Payer: Self-pay | Admitting: Nurse Practitioner

## 2017-09-25 ENCOUNTER — Ambulatory Visit: Payer: Self-pay | Attending: Nurse Practitioner | Admitting: Nurse Practitioner

## 2017-09-25 VITALS — BP 100/69 | HR 75 | Temp 98.6°F | Ht 61.0 in | Wt 135.4 lb

## 2017-09-25 DIAGNOSIS — Z7982 Long term (current) use of aspirin: Secondary | ICD-10-CM | POA: Insufficient documentation

## 2017-09-25 DIAGNOSIS — E119 Type 2 diabetes mellitus without complications: Secondary | ICD-10-CM | POA: Insufficient documentation

## 2017-09-25 DIAGNOSIS — Z79899 Other long term (current) drug therapy: Secondary | ICD-10-CM | POA: Insufficient documentation

## 2017-09-25 DIAGNOSIS — E1169 Type 2 diabetes mellitus with other specified complication: Secondary | ICD-10-CM | POA: Insufficient documentation

## 2017-09-25 DIAGNOSIS — E785 Hyperlipidemia, unspecified: Secondary | ICD-10-CM | POA: Insufficient documentation

## 2017-09-25 DIAGNOSIS — Z7984 Long term (current) use of oral hypoglycemic drugs: Secondary | ICD-10-CM | POA: Insufficient documentation

## 2017-09-25 LAB — GLUCOSE, POCT (MANUAL RESULT ENTRY): POC GLUCOSE: 133 mg/dL — AB (ref 70–99)

## 2017-09-25 MED ORDER — GLIPIZIDE 5 MG PO TABS: ORAL_TABLET | ORAL | 3 refills | Status: DC

## 2017-09-25 MED ORDER — TRUEPLUS LANCETS 28G MISC: 3 refills | Status: DC

## 2017-09-25 MED ORDER — GLUCOSE BLOOD VI STRP: ORAL_STRIP | 12 refills | Status: DC

## 2017-09-25 MED ORDER — LISINOPRIL 2.5 MG PO TABS: 2.5000 mg | ORAL_TABLET | Freq: Every day | ORAL | 2 refills | Status: DC

## 2017-09-25 MED ORDER — METFORMIN HCL 500 MG PO TABS: 1000.0000 mg | ORAL_TABLET | Freq: Two times a day (BID) | ORAL | 2 refills | Status: DC

## 2017-09-25 MED ORDER — PRAVASTATIN SODIUM 40 MG PO TABS: 40.0000 mg | ORAL_TABLET | Freq: Every day | ORAL | 1 refills | Status: DC

## 2017-09-25 MED FILL — TRUEplus LANCETS 28G MISC: 30 days supply | Qty: 100 | Fill #1

## 2017-09-25 MED FILL — ?LISINOPRIL 2.5 MG TABLET: 2.5 | 30 days supply | Qty: 30 | Fill #2

## 2017-09-25 MED FILL — glipiZIDE 5 MG TABS: 5 | 30 days supply | Qty: 60 | Fill #3

## 2017-09-25 MED FILL — TRUE METRIX TEST STRIP: 30 days supply | Qty: 100 | Fill #1

## 2017-09-25 MED FILL — metFORMIN HCL 500 MG TABS: 500 | 120 days supply | Qty: 120 | Fill #0

## 2017-09-25 MED FILL — ?PRAVASTATIN SODIUM 40MG TA: 40 | 90 days supply | Qty: 90 | Fill #0

## 2017-09-25 NOTE — Patient Instructions (Addendum)
Diabetes mellitus y nutrición  Diabetes Mellitus and Nutrition  Si sufre de diabetes (diabetes mellitus), es muy importante tener hábitos alimenticios saludables debido a que sus niveles de azúcar en la sangre (glucosa) se ven afectados en gran medida por lo que come y bebe. Comer alimentos saludables en las cantidades adecuadas, aproximadamente a la misma hora todos los días, lo ayudará a:  · Controlar la glucemia.  · Disminuir el riesgo de sufrir una enfermedad cardíaca.  · Mejorar la presión arterial.  · Alcanzar o mantener un peso saludable.    Todas las personas que sufren de diabetes son diferentes y cada una tiene necesidades diferentes en cuanto a un plan de alimentación. El médico puede recomendarle que trabaje con un especialista en dietas y nutrición (nutricionista) para elaborar el mejor plan para usted. Su plan de alimentación puede variar según factores como:  · Las calorías que necesita.  · Los medicamentos que toma.  · Su peso.  · Sus niveles de glucemia, presión arterial y colesterol.  · Su nivel de actividad.  · Otras afecciones que tenga, como enfermedades cardíacas o renales.    ¿Cómo me afectan los carbohidratos?  Los carbohidratos afectan el nivel de glucemia más que cualquier otro tipo de alimento. La ingesta de carbohidratos naturalmente aumenta la cantidad glucosa en la sangre. El recuento de carbohidratos es un método destinado a llevar un registro de la cantidad de carbohidratos que se ingieren. El recuento de carbohidratos es importante para mantener la glucemia a un nivel saludable, en especial si utiliza insulina o toma determinados medicamentos por vía oral para la diabetes.  Es importante saber la cantidad de carbohidratos que se pueden ingerir en cada comida sin correr ningún riesgo. Esto es diferente en cada persona. El nutricionista puede ayudarlo a calcular la cantidad de carbohidratos que debe ingerir en cada comida y colación.   Los alimentos que contienen carbohidratos incluyen:  · Pan, cereal, arroz, pasta y galletas.  · Papas y maíz.  · Guisantes, frijoles y lentejas.  · Leche y yogur.  · Frutas y jugo.  · Postres, como pasteles, galletitas, helado y caramelos.    ¿Cómo me afecta el alcohol?  El alcohol puede provocar disminuciones súbitas de la glucemia (hipoglucemia), en especial si utiliza insulina o toma determinados medicamentos por vía oral para la diabetes. La hipoglucemia es una afección potencialmente mortal. Los síntomas de la hipoglucemia (somnolencia, mareos y confusión) son similares a los síntomas de haber consumido demasiado alcohol.  Si el médico afirma que el alcohol es seguro para usted, siga estas pautas:  · Limite el consumo de alcohol a no más de 1 medida por día si es mujer y no está embarazada, y a 2 medidas si es hombre. Una medida equivale a 12 oz (355 ml) de cerveza, 5 oz (148 ml) de vino o 1½ oz (44 ml) de bebidas de alta graduación alcohólica.  · No beba con el estómago vacío.  · Manténgase hidratado con agua, gaseosas dietéticas o té helado sin azúcar.  · Tenga en cuenta que las gaseosas comunes, los jugos y otros refrescos pueden contener mucha azúcar y se deben contar como carbohidratos.    Consejos para seguir este plan  Leer las etiquetas de los alimentos  · Comience por controlar el tamaño de la porción en la etiqueta. La cantidad de calorías, carbohidratos, grasas y otros nutrientes mencionados en la etiqueta se basan en una porción del alimento. Muchos alimentos contienen más de una porción por envase.  · Verifique la cantidad total de gramos (g)   de carbohidratos totales en una porción. Puede calcular la cantidad de porciones de carbohidratos al dividir el total de carbohidratos por 15. Por ejemplo, si un alimento posee un total de 30 g de carbohidratos, equivale a 2 porciones de carbohidratos.  · Verifique la cantidad de gramos (g) de grasas saturadas y grasas trans  en una porción. Escoja alimentos que no contengan grasa o que tengan un bajo contenido.  · Controle la cantidad de miligramos (mg) de sodio en una porción. La mayoría de las personas deben limitar la ingesta de sodio total a menos de 2300 mg por día.  · Siempre consulte la información nutricional de los alimentos etiquetados como “con bajo contenido de grasa” o “sin grasa”. Estos alimentos pueden ser más altos en azúcar agregada o en carbohidratos refinados y deben evitarse.  · Hable con el nutricionista para identificar sus objetivos diarios en cuanto a los nutrientes mencionados en la etiqueta.  De compras  · Evite comprar alimentos procesados, enlatados o prehechos. Estos alimentos tienden a tener mayor cantidad de grasa, sodio y azúcar agregada.  · Compre en la zona exterior de la tienda de comestibles. Esta incluye frutas y vegetales frescos, granos a granel, carnes frescas y productos lácteos frescos.  Cocción  · Utilice métodos de cocción a baja temperatura, como hornear, en lugar de métodos de cocción a alta temperatura, como freír en abundante aceite.  · Cocine con aceites saludables, como el aceite de oliva, canola o girasol.  · Evite cocinar con manteca, crema o carnes con alto contenido de grasa.  Planificación de las comidas  · Consuma las comidas y las colaciones de forma regular, preferentemente a la misma hora todos los días. Evite pasar largos períodos de tiempo sin comer.  · Consuma alimentos ricos en fibra, como frutas frescas, verduras, frijoles y cereales integrales. Consulte al nutricionista sobre cuántas porciones de carbohidratos puede consumir en cada comida.  · Consuma entre 4 y 6 onzas de proteínas magras por día, como carnes magras, pollo, pescado, huevos o tofu. 1 onza equivale a 1 onza de carne, pollo o pescado, 1 huevo, o 1/4 taza de tofu.  · Coma algunos alimentos por día que contengan grasas saludables, como aguacates, frutos secos, semillas y pescado.  Estilo de vida     · Controle su nivel de glucemia con regularidad.  · Haga ejercicio al menos 30 minutos, 5 días o más por semana, o como se lo haya indicado el médico.  · Tome los medicamentos como se lo haya indicado el médico.  · No consuma ningún producto que contenga nicotina o tabaco, como cigarrillos y cigarrillos electrónicos. Si necesita ayuda para dejar de fumar, consulte al médico.  · Trabaje con un asesor o instructor en diabetes para identificar estrategias para controlar el estrés y cualquier desafío emocional y social.  ¿Cuáles son algunas de las preguntas que puedo hacerle a mi médico?  · ¿Es necesario que me reúna con un instructor en diabetes?  · ¿Es necesario que me reúna con un nutricionista?  · ¿A qué número puedo llamar si tengo preguntas?  · ¿Cuáles son los mejores momentos para controlar la glucemia?  Dónde encontrar más información:  · Asociación Americana de la Diabetes (American Diabetes Association): diabetes.org/food-and-fitness/food  · Academia de Nutrición y Dietética (Academy of Nutrition and Dietetics): www.eatright.org/resources/health/diseases-and-conditions/diabetes  · Instituto Nacional de la Diabetes y las Enfermedades Digestivas y Renales (National Institute of Diabetes and Digestive and Kidney Diseases) (Institutos Nacionales de Salud, NIH): www.niddk.nih.gov/health-information/diabetes/overview/diet-eating-physical-activity  Resumen  · Un plan de alimentación saludable   lo ayudará a controlar la glucemia y mantener un estilo de vida saludable.  · Trabajar con un especialista en dietas y nutrición (nutricionista) puede ayudarlo a elaborar el mejor plan de alimentación para usted.  · Tenga en cuenta que los carbohidratos y el alcohol tienen efectos inmediatos en sus niveles de glucemia. Es importante contar los carbohidratos y consumir alcohol con prudencia.  Esta información no tiene como fin reemplazar el consejo del médico. Asegúrese de hacerle al médico cualquier pregunta que tenga.   Document Released: 10/28/2007 Document Revised: 11/10/2016 Document Reviewed: 11/10/2016  Elsevier Interactive Patient Education © 2018 Elsevier Inc.

## 2017-09-25 NOTE — Progress Notes (Signed)
Assessment & Plan:  Jetty was seen today for follow-up and referral.  Diagnoses and all orders for this visit:  Type 2 diabetes mellitus without complication, without long-term current use of insulin (HCC) -     Glucose (CBG) -     Ambulatory referral to Ophthalmology -     Basic metabolic panel -     glipiZIDE (GLUCOTROL) 5 MG tablet; Take 0.5 tablet at breakfast, 5 mg at lunch, and 2.5 mg at dinner -     glucose blood (TRUE METRIX BLOOD GLUCOSE TEST) test strip; Use as instructed -     lisinopril (ZESTRIL) 2.5 MG tablet; Take 1 tablet (2.5 mg total) by mouth daily. -     metFORMIN (GLUCOPHAGE) 500 MG tablet; Take 2 tablets (1,000 mg total) by mouth 2 (two) times daily with a meal. -     TRUEPLUS LANCETS 28G MISC; Use as prescribed  Hyperlipidemia associated with type 2 diabetes mellitus (HCC) -     pravastatin (PRAVACHOL) 40 MG tablet; Take 1 tablet (40 mg total) by mouth daily.    Patient has been counseled on age-appropriate routine health concerns for screening and prevention. These are reviewed and up-to-date. Referrals have been placed accordingly. Immunizations are up-to-date or declined.    Subjective:   Chief Complaint  Patient presents with  . Follow-up    Patient is here for a follow-up on diabetes.   . Referral    Patient would like referral to eye doctor.    HPI Heidi Shaw 47 y.o. female presents to office today for follow up. VRI was used to communicate directly with patient for the entire encounter including providing detailed patient instructions.   Hyperlipidemia Endorses medication compliance with Pravachol 40 mg each evening.  She denies any statin intolerance or myalgias.  She is not fasting at this time.  Will obtain fasting lipid panel at next office visit. Lab Results  Component Value Date   LDLCALC 93 02/08/2016    Diabetes Mellitus Type 2 Blood sugars twice a day.  She has her blood sugar log here in the office to present for review.   Average blood sugar ranges 120-130s.  She is knowledgeable regarding her blood sugar goals.  She is overdue for eye exam. Ophthalmology referral will be placed today. She denies any hypo or hyperglycemic symptoms.  A1c is well controlled.  Will recheck at next office visit. Lab Results  Component Value Date   HGBA1C 6.6 06/22/2017     Review of Systems  Constitutional: Negative for fever, malaise/fatigue and weight loss.  Eyes: Negative.  Negative for blurred vision, double vision and photophobia.  Respiratory: Negative.  Negative for cough and shortness of breath.   Cardiovascular: Negative.  Negative for chest pain, palpitations and leg swelling.  Gastrointestinal: Negative.  Negative for abdominal pain, constipation, diarrhea, heartburn, nausea and vomiting.  Musculoskeletal: Negative.  Negative for myalgias.  Neurological: Negative.  Negative for dizziness, focal weakness, seizures and headaches.  Psychiatric/Behavioral: Negative.  Negative for suicidal ideas.    Past Medical History:  Diagnosis Date  . Anemia   . Diabetes mellitus without complication ([place])   . Heart murmur   . Hyperlipemia     Past Surgical History:  Procedure Laterality Date  . CESAREAN SECTION      Family History  Problem Relation Age of Onset  . Diabetes Maternal Uncle   . Diabetes Maternal Uncle     Social History Reviewed with no changes to be made today.  Outpatient Medications Prior to Visit  Medication Sig Dispense Refill  . aspirin 81 MG tablet Take 1 tablet (81 mg total) by mouth daily. Reported on 08/09/2015 90 tablet 4  . Blood Glucose Monitoring Suppl (TRUE METRIX METER) w/Device KIT Use to check blood sugars once a day. 1 kit 0  . glipiZIDE (GLUCOTROL) 5 MG tablet Take 0.5 tablet at breakfast, 5 mg at lunch, and 2.5 mg at dinner 60 tablet 3  . glucose blood (TRUE METRIX BLOOD GLUCOSE TEST) test strip Use as instructed 100 each 12  . lisinopril (ZESTRIL) 2.5 MG tablet Take 1 tablet (2.5  mg total) by mouth daily. 30 tablet 2  . metFORMIN (GLUCOPHAGE) 500 MG tablet Take 2 tablets (1,000 mg total) 2 (two) times daily with a meal by mouth. 120 tablet 2  . pravastatin (PRAVACHOL) 40 MG tablet Take 1 tablet (40 mg total) by mouth daily. 30 tablet 0  . TRUEPLUS LANCETS 28G MISC Use as prescribed 100 each 3   No facility-administered medications prior to visit.     No Known Allergies     Objective:    BP 100/69 (BP Location: Left Arm, Patient Position: Sitting, Cuff Size: Normal)   Pulse 75   Temp 98.6 F (37 C) (Oral)   Ht 5' 1"  (1.549 m)   Wt 135 lb 6.4 oz (61.4 kg)   SpO2 98%   BMI 25.58 kg/m  Wt Readings from Last 3 Encounters:  09/25/17 135 lb 6.4 oz (61.4 kg)  06/22/17 130 lb 3.2 oz (59.1 kg)  09/26/16 127 lb (57.6 kg)    Physical Exam  Constitutional: She is oriented to person, place, and time. She appears well-developed and well-nourished. She is cooperative.  HENT:  Head: Normocephalic and atraumatic.  Eyes: EOM are normal.  Neck: Normal range of motion.  Cardiovascular: Normal rate, regular rhythm, normal heart sounds and intact distal pulses. Exam reveals no gallop and no friction rub.  No murmur heard. Pulmonary/Chest: Effort normal and breath sounds normal. No tachypnea. No respiratory distress. She has no decreased breath sounds. She has no wheezes. She has no rhonchi. She has no rales. She exhibits no tenderness.  Abdominal: Soft. Bowel sounds are normal.  Musculoskeletal: Normal range of motion. She exhibits no edema.  Neurological: She is alert and oriented to person, place, and time. Coordination normal.  Skin: Skin is warm, dry and intact.  Sensory exam of the foot is normal, tested with the monofilament. Good pulses, no lesions or ulcers, good peripheral pulses.  Psychiatric: She has a normal mood and affect. Her behavior is normal. Judgment and thought content normal.  Nursing note and vitals reviewed.      Patient has been counseled  extensively about nutrition and exercise as well as the importance of adherence with medications and regular follow-up. The patient was given clear instructions to go to ER or return to medical center if symptoms don't improve, worsen or new problems develop. The patient verbalized understanding.   Follow-up: Return in about 3 months (around 12/23/2017) for HPL/DM.   Gildardo Pounds, FNP-BC [facility], [place]   09/25/2017, 10:54 PM

## 2017-09-26 LAB — BASIC METABOLIC PANEL
BUN / CREAT RATIO: 19 (ref 9–23)
BUN: 11 mg/dL (ref 6–24)
CALCIUM: 9.7 mg/dL (ref 8.7–10.2)
CHLORIDE: 105 mmol/L (ref 96–106)
CO2: 23 mmol/L (ref 20–29)
Creatinine, Ser: 0.58 mg/dL (ref 0.57–1.00)
GFR, EST AFRICAN AMERICAN: 127 mL/min/{1.73_m2} (ref >59)
GFR, EST NON AFRICAN AMERICAN: 110 mL/min/{1.73_m2} (ref >59)
Glucose: 100 mg/dL — ABNORMAL HIGH (ref 65–99)
POTASSIUM: 4.2 mmol/L (ref 3.5–5.2)
Sodium: 143 mmol/L (ref 134–144)

## 2017-10-01 ENCOUNTER — Telehealth: Payer: Self-pay

## 2017-10-01 NOTE — Telephone Encounter (Signed)
-----   Message from Claiborne RiggZelda W Fleming, NP sent at 09/29/2017  8:41 AM EST ----- Labs are essentially normal. Make sure you are drinking at least 48 oz of water per day. Work on eating a low fat, heart healthy diet and participate in regular aerobic exercise program to control as well. Exercise at least 150 minutes per week.

## 2017-10-01 NOTE — Telephone Encounter (Signed)
CMA attempt to call patient regarding results.  No answer and left a voicemail to callback.  If patient call back please inform:  Labs are essentially normal. Make sure you are drinking at least 48 oz of water per day. Work on eating a low fat, heart healthy diet and participate in regular aerobic exercise program to control as well. Exercise at least 150 minutes per week.

## 2017-10-19 MED FILL — glipiZIDE 5 MG TABS: 5 | 30 days supply | Qty: 60 | Fill #0

## 2017-10-19 MED FILL — LISINOPRIL 2.5 MG TABLET: 2.5 | 30 days supply | Qty: 30 | Fill #0

## 2017-10-19 MED FILL — metFORMIN HCL 500 MG TABS: 500 | 30 days supply | Qty: 120 | Fill #1

## 2017-11-19 ENCOUNTER — Telehealth: Payer: Self-pay | Admitting: Nurse Practitioner

## 2017-11-19 ENCOUNTER — Other Ambulatory Visit: Payer: Self-pay

## 2017-11-19 DIAGNOSIS — E119 Type 2 diabetes mellitus without complications: Secondary | ICD-10-CM

## 2017-11-19 DIAGNOSIS — E1169 Type 2 diabetes mellitus with other specified complication: Secondary | ICD-10-CM

## 2017-11-19 DIAGNOSIS — E785 Hyperlipidemia, unspecified: Secondary | ICD-10-CM

## 2017-11-19 MED ORDER — PRAVASTATIN SODIUM 40 MG PO TABS: 40.0000 mg | ORAL_TABLET | Freq: Every day | ORAL | 1 refills | Status: DC

## 2017-11-19 MED ORDER — LISINOPRIL 2.5 MG PO TABS: 2.5000 mg | ORAL_TABLET | Freq: Every day | ORAL | 2 refills | Status: DC

## 2017-11-19 MED ORDER — GLIPIZIDE 5 MG PO TABS: ORAL_TABLET | ORAL | 3 refills | Status: DC

## 2017-11-19 MED ORDER — METFORMIN HCL 500 MG PO TABS: 1000.0000 mg | ORAL_TABLET | Freq: Two times a day (BID) | ORAL | 2 refills | Status: DC

## 2017-11-19 MED FILL — LISINOPRIL 2.5 MG TABLET: 2.5 | 30 days supply | Qty: 30 | Fill #0

## 2017-11-19 MED FILL — ?METFORMIN HCL 500MG TABLET: 500 | 30 days supply | Qty: 120 | Fill #0

## 2017-11-19 MED FILL — glipiZIDE 5 MG TABS: 5 | 30 days supply | Qty: 60 | Fill #0

## 2017-11-19 NOTE — Telephone Encounter (Signed)
RX sent

## 2017-11-19 NOTE — Telephone Encounter (Signed)
Patient was informed and verbalized understanding with interpreter [phone].

## 2017-11-19 NOTE — Telephone Encounter (Signed)
Patient request to have her medication refill at [facility].  metFORMIN (GLUCOPHAGE) 500 MG tablet [[id number]][[id number]]  glipiZIDE (GLUCOTROL) 5 MG tablet [[id number]][[id number]]  pravastatin (PRAVACHOL) 40 MG tablet [[id number] lisinopril (ZESTRIL) 2.5 MG tablet [[id number]][[id number]]  Refill sent.

## 2017-11-19 NOTE — Telephone Encounter (Signed)
Patient called and requested for listed medications to be refilled at [facility].  metFORMIN (GLUCOPHAGE) 500 MG tablet [[id number]][[id number]]  glipiZIDE (GLUCOTROL) 5 MG tablet [[id number]][[id number]]  pravastatin (PRAVACHOL) 40 MG tablet [[id number] lisinopril (ZESTRIL) 2.5 MG tablet [[id number]][[id number]]

## 2017-12-21 MED FILL — ?METFORMIN HCL 500MG TABLET: 500 | 30 days supply | Qty: 120 | Fill #1

## 2017-12-21 MED FILL — glipiZIDE 5 MG TABS: 5 | 30 days supply | Qty: 60 | Fill #1

## 2017-12-21 MED FILL — LISINOPRIL 2.5 MG TABLET: 2.5 | 30 days supply | Qty: 30 | Fill #1

## 2017-12-21 MED FILL — PRAVASTATIN NA 40 MG TAB: 40 | 30 days supply | Qty: 30 | Fill #0

## 2017-12-25 ENCOUNTER — Ambulatory Visit: Payer: Self-pay | Admitting: Nurse Practitioner

## 2018-01-13 ENCOUNTER — Encounter: Payer: Self-pay | Admitting: Nurse Practitioner

## 2018-01-13 ENCOUNTER — Ambulatory Visit: Payer: Self-pay | Attending: Nurse Practitioner | Admitting: Nurse Practitioner

## 2018-01-13 VITALS — BP 100/69 | HR 68 | Temp 98.5°F | Ht 61.0 in | Wt 134.0 lb

## 2018-01-13 DIAGNOSIS — E785 Hyperlipidemia, unspecified: Secondary | ICD-10-CM | POA: Insufficient documentation

## 2018-01-13 DIAGNOSIS — E1169 Type 2 diabetes mellitus with other specified complication: Secondary | ICD-10-CM

## 2018-01-13 DIAGNOSIS — Z79899 Other long term (current) drug therapy: Secondary | ICD-10-CM | POA: Insufficient documentation

## 2018-01-13 DIAGNOSIS — E119 Type 2 diabetes mellitus without complications: Secondary | ICD-10-CM | POA: Insufficient documentation

## 2018-01-13 DIAGNOSIS — Z7982 Long term (current) use of aspirin: Secondary | ICD-10-CM | POA: Insufficient documentation

## 2018-01-13 DIAGNOSIS — Z7984 Long term (current) use of oral hypoglycemic drugs: Secondary | ICD-10-CM | POA: Insufficient documentation

## 2018-01-13 LAB — GLUCOSE, POCT (MANUAL RESULT ENTRY): POC GLUCOSE: 99 mg/dL (ref 70–99)

## 2018-01-13 LAB — POCT GLYCOSYLATED HEMOGLOBIN (HGB A1C): HEMOGLOBIN A1C: 7.2 % — AB (ref 4.0–5.6)

## 2018-01-13 MED ORDER — FLUTICASONE PROPIONATE 50 MCG/ACT NA SUSP: 2.0000 | Freq: Every day | NASAL | 6 refills | Status: DC

## 2018-01-13 MED ORDER — LISINOPRIL 2.5 MG PO TABS: 2.5000 mg | ORAL_TABLET | Freq: Every day | ORAL | 2 refills | Status: DC

## 2018-01-13 MED ORDER — GLIPIZIDE 5 MG PO TABS: ORAL_TABLET | ORAL | 3 refills | Status: DC

## 2018-01-13 MED ORDER — GLUCOSE BLOOD VI STRP: ORAL_STRIP | 12 refills | Status: DC

## 2018-01-13 MED ORDER — METFORMIN HCL 500 MG PO TABS: 1000.0000 mg | ORAL_TABLET | Freq: Two times a day (BID) | ORAL | 2 refills | Status: DC

## 2018-01-13 MED FILL — metFORMIN HCL 500 MG TABS: 500 | 30 days supply | Qty: 120 | Fill #2

## 2018-01-13 MED FILL — FLUTICASONE PROP 50 MCG SPR: 50 | 30 days supply | Qty: 16 | Fill #0

## 2018-01-13 MED FILL — TRUE METRIX TEST STRIP: 30 days supply | Qty: 100 | Fill #2

## 2018-01-13 NOTE — Progress Notes (Signed)
Assessment & Plan:  [name] was seen today for follow-up.  Diagnoses and all orders for this visit:  Type 2 diabetes mellitus without complication, without long-term current use of insulin (HCC) -     Glucose (CBG) -     HgB A1c -     glucose blood (TRUE METRIX BLOOD GLUCOSE TEST) test strip; Use as instructed -     metFORMIN (GLUCOPHAGE) 500 MG tablet; Take 2 tablets (1,000 mg total) by mouth 2 (two) times daily with a meal. -     lisinopril (ZESTRIL) 2.5 MG tablet; Take 1 tablet (2.5 mg total) by mouth daily. -     glipiZIDE (GLUCOTROL) 5 MG tablet; Take 0.5 tablet at breakfast, 0.5 tablet at lunch, and 1 (one) tablet at dinner -     TSH Continue blood sugar control as discussed in office today, low carbohydrate diet, and regular physical exercise as tolerated, 150 minutes per week (30 min each day, 5 days per week, or 50 min 3 days per week). Keep blood sugar logs with fasting goal of 80-130 mg/dl, post prandial less than 180.  For Hypoglycemia: BS <60 and Hyperglycemia BS >400; contact the clinic ASAP. Annual eye exams and foot exams are recommended.  Hyperlipidemia associated with type 2 diabetes mellitus ([place]) -     Lipid panel INSTRUCTIONS: Work on a low fat, heart healthy diet and participate in regular aerobic exercise program by working out at least 150 minutes per week. No fried foods. No junk foods, sodas, sugary drinks, unhealthy snacking, alcohol or smoking.    Other orders -     fluticasone (FLONASE) 50 MCG/ACT nasal spray; Place 2 sprays into both nostrils daily for left middle ear effusion    Patient has been counseled on age-appropriate routine health concerns for screening and prevention. These are reviewed and up-to-date. Referrals have been placed accordingly. Immunizations are up-to-date or declined.    Subjective:   Chief Complaint  Patient presents with  . Follow-up    Pt. is here to follow-up on her diabetes.    HPI Heidi Shaw 47 y.o. female  presents to office today for follow up to:  Type 2 Diabetes Mellitus Disease course has been are worsening.  Up from 6.6 to 7.2.  She has been slightly variant from her normal dietary habits. There are no hypoglycemic symptoms. There are no hypoglycemic complications. Symptoms are stable. She has her diabetes log with her today. Fasting averages: 130s. There are no diabetic complications. Risk factors for coronary artery disease include  dyslipidemia, diabetes mellitus. Current diabetic treatment includes metformin 1000 mg BID and glipizide 10 mg total daily. Patient is compliant with treatment all of the time and monitors blood glucose at home once  per day.  Weight is  stable. Patient follows a generally healthy diet. Meal planning includes avoidance of concentrated sweets. Patient has not seen a dietician. Patient is not compliant with exercise.   An ACE inhibitor/angiotensin II receptor blocker is being taken. She does not see a podiatrist. Eye exam is not current.   Lab Results  Component Value Date   HGBA1C 7.2 (A) 01/13/2018   Hyperlipidemia Patient presents for follow up to hyperlipidemia.  She is medication compliant. She is diet compliant and denies feeding intolerance, lower extremity edema, poor exercise tolerance and skin xanthelasma or statin intolerance including myalgias.  Lab Results  Component Value Date   CHOL 168 02/08/2016   Lab Results  Component Value Date   HDL 48 02/08/2016  Lab Results  Component Value Date   LDLCALC 93 02/08/2016   Lab Results  Component Value Date   TRIG 136 02/08/2016   Lab Results  Component Value Date   CHOLHDL 3.5 02/08/2016    Review of Systems  Constitutional: Negative for fever, malaise/fatigue and weight loss.  HENT: Positive for ear pain (left). Negative for nosebleeds.   Eyes: Negative.  Negative for blurred vision, double vision and photophobia.  Respiratory: Negative.  Negative for cough and shortness of breath.     Cardiovascular: Negative.  Negative for chest pain, palpitations and leg swelling.  Gastrointestinal: Negative.  Negative for heartburn, nausea and vomiting.  Musculoskeletal: Negative.  Negative for myalgias.  Neurological: Negative.  Negative for dizziness, focal weakness, seizures and headaches.  Psychiatric/Behavioral: Negative.  Negative for suicidal ideas.    Past Medical History:  Diagnosis Date  . Anemia   . Diabetes mellitus without complication ([place])   . Heart murmur   . Hyperlipemia     Past Surgical History:  Procedure Laterality Date  . CESAREAN SECTION      Family History  Problem Relation Age of Onset  . Diabetes Maternal Uncle   . Diabetes Maternal Uncle     Social History Reviewed with no changes to be made today.   Outpatient Medications Prior to Visit  Medication Sig Dispense Refill  . aspirin 81 MG tablet Take 1 tablet (81 mg total) by mouth daily. Reported on 08/09/2015 90 tablet 4  . Blood Glucose Monitoring Suppl (TRUE METRIX METER) w/Device KIT Use to check blood sugars once a day. 1 kit 0  . pravastatin (PRAVACHOL) 40 MG tablet Take 1 tablet (40 mg total) by mouth daily. 90 tablet 1  . TRUEPLUS LANCETS 28G MISC Use as prescribed 100 each 3  . glipiZIDE (GLUCOTROL) 5 MG tablet Take 0.5 tablet at breakfast, 5 mg at lunch, and 2.5 mg at dinner 60 tablet 3  . glucose blood (TRUE METRIX BLOOD GLUCOSE TEST) test strip Use as instructed 100 each 12  . lisinopril (ZESTRIL) 2.5 MG tablet Take 1 tablet (2.5 mg total) by mouth daily. 30 tablet 2  . metFORMIN (GLUCOPHAGE) 500 MG tablet Take 2 tablets (1,000 mg total) by mouth 2 (two) times daily with a meal. 120 tablet 2   No facility-administered medications prior to visit.     No Known Allergies     Objective:    BP 100/69 (BP Location: Left Arm, Patient Position: Sitting, Cuff Size: Normal)   Pulse 68   Temp 98.5 F (36.9 C) (Oral)   Ht 5' 1" (1.549 m)   Wt 134 lb (60.8 kg)   SpO2 98%   BMI 25.32  kg/m  Wt Readings from Last 3 Encounters:  01/13/18 134 lb (60.8 kg)  09/25/17 135 lb 6.4 oz (61.4 kg)  06/22/17 130 lb 3.2 oz (59.1 kg)    Physical Exam  Constitutional: She is oriented to person, place, and time. She appears well-developed and well-nourished. She is cooperative.  HENT:  Head: Normocephalic and atraumatic.  Left Ear: Hearing, external ear and ear canal normal. A middle ear effusion is present.  Eyes: EOM are normal.  Neck: Normal range of motion.  Cardiovascular: Normal rate, regular rhythm, normal heart sounds and intact distal pulses. Exam reveals no gallop and no friction rub.  No murmur heard. Pulmonary/Chest: Effort normal and breath sounds normal. No tachypnea. No respiratory distress. She has no decreased breath sounds. She has no wheezes. She has no rhonchi. She  has no rales. She exhibits no tenderness.  Abdominal: Soft. Bowel sounds are normal.  Musculoskeletal: Normal range of motion. She exhibits no edema.  Neurological: She is alert and oriented to person, place, and time. Coordination normal.  Skin: Skin is warm and dry.  Psychiatric: She has a normal mood and affect. Her behavior is normal. Judgment and thought content normal.  Nursing note and vitals reviewed.        Patient has been counseled extensively about nutrition and exercise as well as the importance of adherence with medications and regular follow-up. The patient was given clear instructions to go to ER or return to medical center if symptoms don't improve, worsen or new problems develop. The patient verbalized understanding.   Follow-up: Return in about 3 months (around 04/15/2018).   Gildardo Pounds, FNP-BC [facility] [place], [place]   01/13/2018, 9:31 AM

## 2018-01-13 NOTE — Patient Instructions (Signed)
Diabetes mellitus y nutrición  Diabetes Mellitus and Nutrition  Si sufre de diabetes (diabetes mellitus), es muy importante tener hábitos alimenticios saludables debido a que sus niveles de azúcar en la sangre (glucosa) se ven afectados en gran medida por lo que come y bebe. Comer alimentos saludables en las cantidades adecuadas, aproximadamente a la misma hora todos los días, lo ayudará a:  · Controlar la glucemia.  · Disminuir el riesgo de sufrir una enfermedad cardíaca.  · Mejorar la presión arterial.  · Alcanzar o mantener un peso saludable.    Todas las personas que sufren de diabetes son diferentes y cada una tiene necesidades diferentes en cuanto a un plan de alimentación. El médico puede recomendarle que trabaje con un especialista en dietas y nutrición (nutricionista) para elaborar el mejor plan para usted. Su plan de alimentación puede variar según factores como:  · Las calorías que necesita.  · Los medicamentos que toma.  · Su peso.  · Sus niveles de glucemia, presión arterial y colesterol.  · Su nivel de actividad.  · Otras afecciones que tenga, como enfermedades cardíacas o renales.    ¿Cómo me afectan los carbohidratos?  Los carbohidratos afectan el nivel de glucemia más que cualquier otro tipo de alimento. La ingesta de carbohidratos naturalmente aumenta la cantidad glucosa en la sangre. El recuento de carbohidratos es un método destinado a llevar un registro de la cantidad de carbohidratos que se ingieren. El recuento de carbohidratos es importante para mantener la glucemia a un nivel saludable, en especial si utiliza insulina o toma determinados medicamentos por vía oral para la diabetes.  Es importante saber la cantidad de carbohidratos que se pueden ingerir en cada comida sin correr ningún riesgo. Esto es diferente en cada persona. El nutricionista puede ayudarlo a calcular la cantidad de carbohidratos que debe ingerir en cada comida y colación.   Los alimentos que contienen carbohidratos incluyen:  · Pan, cereal, arroz, pasta y galletas.  · Papas y maíz.  · Guisantes, frijoles y lentejas.  · Leche y yogur.  · Frutas y jugo.  · Postres, como pasteles, galletitas, helado y caramelos.    ¿Cómo me afecta el alcohol?  El alcohol puede provocar disminuciones súbitas de la glucemia (hipoglucemia), en especial si utiliza insulina o toma determinados medicamentos por vía oral para la diabetes. La hipoglucemia es una afección potencialmente mortal. Los síntomas de la hipoglucemia (somnolencia, mareos y confusión) son similares a los síntomas de haber consumido demasiado alcohol.  Si el médico afirma que el alcohol es seguro para usted, siga estas pautas:  · Limite el consumo de alcohol a no más de 1 medida por día si es mujer y no está embarazada, y a 2 medidas si es hombre. Una medida equivale a 12 oz (355 ml) de cerveza, 5 oz (148 ml) de vino o 1½ oz (44 ml) de bebidas de alta graduación alcohólica.  · No beba con el estómago vacío.  · Manténgase hidratado con agua, gaseosas dietéticas o té helado sin azúcar.  · Tenga en cuenta que las gaseosas comunes, los jugos y otros refrescos pueden contener mucha azúcar y se deben contar como carbohidratos.    Consejos para seguir este plan  Leer las etiquetas de los alimentos  · Comience por controlar el tamaño de la porción en la etiqueta. La cantidad de calorías, carbohidratos, grasas y otros nutrientes mencionados en la etiqueta se basan en una porción del alimento. Muchos alimentos contienen más de una porción por envase.  · Verifique la cantidad total de gramos (g)   de carbohidratos totales en una porción. Puede calcular la cantidad de porciones de carbohidratos al dividir el total de carbohidratos por 15. Por ejemplo, si un alimento posee un total de 30 g de carbohidratos, equivale a 2 porciones de carbohidratos.  · Verifique la cantidad de gramos (g) de grasas saturadas y grasas trans  en una porción. Escoja alimentos que no contengan grasa o que tengan un bajo contenido.  · Controle la cantidad de miligramos (mg) de sodio en una porción. La mayoría de las personas deben limitar la ingesta de sodio total a menos de 2300 mg por día.  · Siempre consulte la información nutricional de los alimentos etiquetados como “con bajo contenido de grasa” o “sin grasa”. Estos alimentos pueden ser más altos en azúcar agregada o en carbohidratos refinados y deben evitarse.  · Hable con el nutricionista para identificar sus objetivos diarios en cuanto a los nutrientes mencionados en la etiqueta.  De compras  · Evite comprar alimentos procesados, enlatados o prehechos. Estos alimentos tienden a tener mayor cantidad de grasa, sodio y azúcar agregada.  · Compre en la zona exterior de la tienda de comestibles. Esta incluye frutas y vegetales frescos, granos a granel, carnes frescas y productos lácteos frescos.  Cocción  · Utilice métodos de cocción a baja temperatura, como hornear, en lugar de métodos de cocción a alta temperatura, como freír en abundante aceite.  · Cocine con aceites saludables, como el aceite de oliva, canola o girasol.  · Evite cocinar con manteca, crema o carnes con alto contenido de grasa.  Planificación de las comidas  · Consuma las comidas y las colaciones de forma regular, preferentemente a la misma hora todos los días. Evite pasar largos períodos de tiempo sin comer.  · Consuma alimentos ricos en fibra, como frutas frescas, verduras, frijoles y cereales integrales. Consulte al nutricionista sobre cuántas porciones de carbohidratos puede consumir en cada comida.  · Consuma entre 4 y 6 onzas de proteínas magras por día, como carnes magras, pollo, pescado, huevos o tofu. 1 onza equivale a 1 onza de carne, pollo o pescado, 1 huevo, o 1/4 taza de tofu.  · Coma algunos alimentos por día que contengan grasas saludables, como aguacates, frutos secos, semillas y pescado.  Estilo de vida     · Controle su nivel de glucemia con regularidad.  · Haga ejercicio al menos 30 minutos, 5 días o más por semana, o como se lo haya indicado el médico.  · Tome los medicamentos como se lo haya indicado el médico.  · No consuma ningún producto que contenga nicotina o tabaco, como cigarrillos y cigarrillos electrónicos. Si necesita ayuda para dejar de fumar, consulte al médico.  · Trabaje con un asesor o instructor en diabetes para identificar estrategias para controlar el estrés y cualquier desafío emocional y social.  ¿Cuáles son algunas de las preguntas que puedo hacerle a mi médico?  · ¿Es necesario que me reúna con un instructor en diabetes?  · ¿Es necesario que me reúna con un nutricionista?  · ¿A qué número puedo llamar si tengo preguntas?  · ¿Cuáles son los mejores momentos para controlar la glucemia?  Dónde encontrar más información:  · Asociación Americana de la Diabetes (American Diabetes Association): diabetes.org/food-and-fitness/food  · Academia de Nutrición y Dietética (Academy of Nutrition and Dietetics): www.eatright.org/resources/health/diseases-and-conditions/diabetes  · Instituto Nacional de la Diabetes y las Enfermedades Digestivas y Renales (National Institute of Diabetes and Digestive and Kidney Diseases) (Institutos Nacionales de Salud, NIH): www.niddk.nih.gov/health-information/diabetes/overview/diet-eating-physical-activity  Resumen  · Un plan de alimentación saludable   lo ayudará a controlar la glucemia y mantener un estilo de vida saludable.  · Trabajar con un especialista en dietas y nutrición (nutricionista) puede ayudarlo a elaborar el mejor plan de alimentación para usted.  · Tenga en cuenta que los carbohidratos y el alcohol tienen efectos inmediatos en sus niveles de glucemia. Es importante contar los carbohidratos y consumir alcohol con prudencia.  Esta información no tiene como fin reemplazar el consejo del médico. Asegúrese de hacerle al médico cualquier pregunta que tenga.   Document Released: 10/28/2007 Document Revised: 11/10/2016 Document Reviewed: 11/10/2016  Elsevier Interactive Patient Education © 2018 Elsevier Inc.

## 2018-01-14 LAB — LIPID PANEL
CHOL/HDL RATIO: 3.4 ratio (ref 0.0–4.4)
Cholesterol, Total: 199 mg/dL (ref 100–199)
HDL: 59 mg/dL (ref >39)
LDL Calculated: 113 mg/dL — ABNORMAL HIGH (ref 0–99)
Triglycerides: 134 mg/dL (ref 0–149)
VLDL CHOLESTEROL CAL: 27 mg/dL (ref 5–40)

## 2018-01-14 LAB — TSH: TSH: 1.99 u[IU]/mL (ref 0.450–4.500)

## 2018-01-15 ENCOUNTER — Telehealth: Payer: Self-pay | Admitting: Nurse Practitioner

## 2018-01-15 ENCOUNTER — Ambulatory Visit: Payer: Self-pay | Attending: Nurse Practitioner

## 2018-01-15 NOTE — Telephone Encounter (Signed)
Patient came into apply for OC would like a referral to the dentist if possible for a cleaning .

## 2018-01-15 NOTE — Telephone Encounter (Signed)
Will route to PCP 

## 2018-01-18 NOTE — Telephone Encounter (Signed)
Are we accepting referrals for the orange card to the dentist?

## 2018-01-19 ENCOUNTER — Other Ambulatory Visit: Payer: Self-pay | Admitting: Nurse Practitioner

## 2018-01-19 DIAGNOSIS — Z0120 Encounter for dental examination and cleaning without abnormal findings: Secondary | ICD-10-CM

## 2018-01-19 NOTE — Telephone Encounter (Signed)
Yes, Financial assistance said that there is a place that Is accepting orange card for dentist.

## 2018-01-19 NOTE — Telephone Encounter (Signed)
Referral has been placed. 

## 2018-01-21 MED FILL — glipiZIDE 5 MG TABS: 5 | 30 days supply | Qty: 60 | Fill #1

## 2018-01-21 MED FILL — PRAVASTATIN NA 40 MG TAB: 40 | 30 days supply | Qty: 30 | Fill #1

## 2018-01-21 MED FILL — LISINOPRIL 2.5 MG TABLET: 2.5 | 30 days supply | Qty: 30 | Fill #2

## 2018-01-21 NOTE — Telephone Encounter (Signed)
CMA attempt to call patient to inform on lab results.  No answer and left a VM for patient to call back.  If patient call back, please inform: Thyroid level is normal. LDL cholesterol is slightly elevated. INSTRUCTIONS: Work on a low fat, heart healthy diet and participate in regular aerobic exercise program by working out at least 150 minutes per week. No fried foods. No junk foods, sodas, sugary drinks, unhealthy snacking, alcohol or smoking.  A letter will be send out to patient.

## 2018-01-21 NOTE — Telephone Encounter (Signed)
-----   Message from Claiborne RiggZelda W Fleming, NP sent at 01/20/2018  9:18 AM EDT ----- Thyroid level is normal. LDL cholesterol is slightly elevated. INSTRUCTIONS: Work on a low fat, heart healthy diet and participate in regular aerobic exercise program by working out at least 150 minutes per week. No fried foods. No junk foods, sodas, sugary drinks, unhealthy snacking, alcohol or smoking.

## 2018-02-19 MED FILL — LISINOPRIL 2.5 MG TABLET: 2.5 | 30 days supply | Qty: 30 | Fill #0

## 2018-02-19 MED FILL — ?GLIPIZIDE 5MG TABLET: 5 | 30 days supply | Qty: 60 | Fill #2

## 2018-02-19 MED FILL — ?PRAVASTATIN NA 40 MG TAB: 40 | 30 days supply | Qty: 30 | Fill #2

## 2018-03-25 MED FILL — ?PRAVASTATIN NA 40 MG TAB: 40 | 30 days supply | Qty: 30 | Fill #3

## 2018-03-25 MED FILL — ?GLIPIZIDE 5MG TABLET: 5 | 30 days supply | Qty: 60 | Fill #3

## 2018-03-25 MED FILL — LISINOPRIL 2.5 MG TABLET: 2.5 | 30 days supply | Qty: 30 | Fill #1

## 2018-03-25 MED FILL — TRUE METRIX TEST STRIP: 30 days supply | Qty: 100 | Fill #3

## 2018-03-25 MED FILL — ?METFORMIN HCL 500MG TABS: 500 | 30 days supply | Qty: 120 | Fill #0

## 2018-04-08 ENCOUNTER — Ambulatory Visit (HOSPITAL_COMMUNITY): Payer: Self-pay

## 2018-04-13 ENCOUNTER — Ambulatory Visit: Payer: Self-pay | Attending: Nurse Practitioner | Admitting: Nurse Practitioner

## 2018-04-13 VITALS — BP 102/70 | HR 76 | Temp 98.9°F | Ht 61.0 in | Wt 132.0 lb

## 2018-04-13 DIAGNOSIS — Z79899 Other long term (current) drug therapy: Secondary | ICD-10-CM | POA: Insufficient documentation

## 2018-04-13 DIAGNOSIS — E118 Type 2 diabetes mellitus with unspecified complications: Secondary | ICD-10-CM

## 2018-04-13 DIAGNOSIS — E1169 Type 2 diabetes mellitus with other specified complication: Secondary | ICD-10-CM

## 2018-04-13 DIAGNOSIS — Z23 Encounter for immunization: Secondary | ICD-10-CM | POA: Insufficient documentation

## 2018-04-13 DIAGNOSIS — Z7984 Long term (current) use of oral hypoglycemic drugs: Secondary | ICD-10-CM | POA: Insufficient documentation

## 2018-04-13 DIAGNOSIS — E785 Hyperlipidemia, unspecified: Secondary | ICD-10-CM | POA: Insufficient documentation

## 2018-04-13 DIAGNOSIS — E119 Type 2 diabetes mellitus without complications: Secondary | ICD-10-CM | POA: Insufficient documentation

## 2018-04-13 DIAGNOSIS — Z7982 Long term (current) use of aspirin: Secondary | ICD-10-CM | POA: Insufficient documentation

## 2018-04-13 LAB — POCT GLYCOSYLATED HEMOGLOBIN (HGB A1C): Hemoglobin A1C: 6.7 % — AB (ref 4.0–5.6)

## 2018-04-13 LAB — GLUCOSE, POCT (MANUAL RESULT ENTRY): POC GLUCOSE: 51 mg/dL — AB (ref 70–99)

## 2018-04-13 NOTE — Progress Notes (Signed)
Assessment & Plan:  Lady was seen today for follow-up.  Diagnoses and all orders for this visit:  Controlled type 2 diabetes mellitus with complication, without long-term current use of insulin (HCC) -     HgB A1c -     Glucose (CBG)  Need for immunization against influenza -     Flu Vaccine QUAD 36+ mos IM    Patient has been counseled on age-appropriate routine health concerns for screening and prevention. These are reviewed and up-to-date. Referrals have been placed accordingly. Immunizations are up-to-date or declined.    Subjective:   Chief Complaint  Patient presents with  . Follow-up    Pt. is here to follow-up on her diabetes.    HPI Heidi Shaw 47 y.o. female presents to office today for follow up of DM.  DM TYPE 2 She has a blood sugar log with her today. Chekcing blood sugars once a day. Average 130-140s. A1c improving. Down from 7.2 to 6.7. She endorses medication compliance taking metformin 1000 mg BID and glipizide 33m BID. She denies any hypo or hyperglycemic symptoms. She is overdue for eye exam. Patient has been advised to apply for financial assistance and schedule to see our financial counselor.  Lab Results  Component Value Date   HGBA1C 6.7 (A) 04/13/2018   Hyperlipidemia Patient presents for follow up to hyperlipidemia.  She is medication compliant taking pravastatin 473m. She is diet compliant and denies skin xanthelasma or statin intolerance including myalgias. LDL is not at goal.  Lab Results  Component Value Date   CHOL 199 01/13/2018   Lab Results  Component Value Date   HDL 59 01/13/2018   Lab Results  Component Value Date   LDLCALC 113 (H) 01/13/2018   Lab Results  Component Value Date   TRIG 134 01/13/2018   Lab Results  Component Value Date   CHOLHDL 3.4 01/13/2018   No results found for: LDLDIRECT Lab Results  Component Value Date   LDLCALC 113 (H) 01/13/2018   Review of Systems  Constitutional: Negative for  fever, malaise/fatigue and weight loss.  HENT: Negative.  Negative for nosebleeds.   Eyes: Negative.  Negative for blurred vision, double vision and photophobia.  Respiratory: Negative.  Negative for cough and shortness of breath.   Cardiovascular: Negative.  Negative for chest pain, palpitations and leg swelling.  Gastrointestinal: Negative.  Negative for heartburn, nausea and vomiting.  Musculoskeletal: Negative.  Negative for myalgias.  Neurological: Negative.  Negative for dizziness, focal weakness, seizures and headaches.  Psychiatric/Behavioral: Negative.  Negative for suicidal ideas.    Past Medical History:  Diagnosis Date  . Anemia   . Diabetes mellitus without complication [place]  . Heart murmur   . Hyperlipemia     Past Surgical History:  Procedure Laterality Date  . CESAREAN SECTION      Family History  Problem Relation Age of Onset  . Diabetes Maternal Uncle   . Diabetes Maternal Uncle     Social History Reviewed with no changes to be made today.   Outpatient Medications Prior to Visit  Medication Sig Dispense Refill  . aspirin 81 MG tablet Take 1 tablet (81 mg total) by mouth daily. Reported on 08/09/2015 90 tablet 4  . Blood Glucose Monitoring Suppl (TRUE METRIX METER) w/Device KIT Use to check blood sugars once a day. 1 kit 0  . fluticasone (FLONASE) 50 MCG/ACT nasal spray Place 2 sprays into both nostrils daily. 16 g 6  . glipiZIDE (GLUCOTROL) 5  MG tablet Take 0.5 tablet at breakfast, 0.5 tablet at lunch, and 1 (one) tablet at dinner 60 tablet 3  . glucose blood (TRUE METRIX BLOOD GLUCOSE TEST) test strip Use as instructed 100 each 12  . lisinopril (ZESTRIL) 2.5 MG tablet Take 1 tablet (2.5 mg total) by mouth daily. 30 tablet 2  . metFORMIN (GLUCOPHAGE) 500 MG tablet Take 2 tablets (1,000 mg total) by mouth 2 (two) times daily with a meal. 120 tablet 2  . TRUEPLUS LANCETS 28G MISC Use as prescribed 100 each 3  . pravastatin (PRAVACHOL) 40 MG tablet Take 1  tablet (40 mg total) by mouth daily. 90 tablet 1   No facility-administered medications prior to visit.     No Known Allergies     Objective:    BP 102/70 (BP Location: Left Arm, Patient Position: Sitting, Cuff Size: Normal)   Pulse 76   Temp 98.9 F (37.2 C) (Oral)   Ht 5' 1" (1.549 m)   Wt 132 lb (59.9 kg)   SpO2 97%   BMI 24.94 kg/m  Wt Readings from Last 3 Encounters:  04/13/18 132 lb (59.9 kg)  01/13/18 134 lb (60.8 kg)  09/25/17 135 lb 6.4 oz (61.4 kg)    Physical Exam  Constitutional: She is oriented to person, place, and time. She appears well-developed and well-nourished. She is cooperative.  HENT:  Head: Normocephalic and atraumatic.  Eyes: EOM are normal.  Neck: Normal range of motion.  Cardiovascular: Normal rate, regular rhythm and normal heart sounds. Exam reveals no gallop and no friction rub.  No murmur heard. Pulmonary/Chest: Effort normal and breath sounds normal. No tachypnea. No respiratory distress. She has no decreased breath sounds. She has no wheezes. She has no rhonchi. She has no rales. She exhibits no tenderness.  Abdominal: Bowel sounds are normal.  Musculoskeletal: Normal range of motion. She exhibits no edema.  Neurological: She is alert and oriented to person, place, and time. Coordination normal.  Skin: Skin is warm and dry.  Psychiatric: She has a normal mood and affect. Her behavior is normal. Judgment and thought content normal.  Nursing note and vitals reviewed.     Patient has been counseled extensively about nutrition and exercise as well as the importance of adherence with medications and regular follow-up. The patient was given clear instructions to go to ER or return to medical center if symptoms don't improve, worsen or new problems develop. The patient verbalized understanding.   Follow-up: Return in about 3 months (around 07/13/2018).   Gildardo Pounds, FNP-BC [facility] and [facility] [place],  [place]   04/14/2018, 11:02 PM

## 2018-04-14 ENCOUNTER — Encounter: Payer: Self-pay | Admitting: Nurse Practitioner

## 2018-04-23 ENCOUNTER — Telehealth: Payer: Self-pay | Admitting: Nurse Practitioner

## 2018-04-23 DIAGNOSIS — E785 Hyperlipidemia, unspecified: Principal | ICD-10-CM

## 2018-04-23 DIAGNOSIS — E1169 Type 2 diabetes mellitus with other specified complication: Secondary | ICD-10-CM

## 2018-04-23 MED ORDER — PRAVASTATIN SODIUM 40 MG PO TABS: 40.0000 mg | ORAL_TABLET | Freq: Every day | ORAL | 2 refills | Status: DC

## 2018-04-23 MED FILL — ?PRAVASTATIN NA 40 MG TAB: 40 | 30 days supply | Qty: 30 | Fill #4

## 2018-04-23 MED FILL — ?GLIPIZIDE 5MG TABLET: 5 | 30 days supply | Qty: 60 | Fill #0

## 2018-04-23 MED FILL — ?METFORMIN HCL 500MG TABS: 500 | 30 days supply | Qty: 120 | Fill #1

## 2018-04-23 NOTE — Telephone Encounter (Signed)
Pt called to request a medication refill on -pravastatin (PRAVACHOL) 40 MG tablet  -Liver medication Please follow up

## 2018-04-23 NOTE — Telephone Encounter (Signed)
Pravastatin was refilled, pt is not on any liver meds nor does her chart note any liver problems.

## 2018-05-11 MED FILL — TRUE METRIX TEST STRIP: 30 days supply | Qty: 100 | Fill #4

## 2018-05-11 MED FILL — LISINOPRIL 2.5 MG TABLET: 2.5 | 30 days supply | Qty: 30 | Fill #2

## 2018-05-12 MED FILL — ?GLIPIZIDE 5MG TABLET: 5 | 30 days supply | Qty: 60 | Fill #1

## 2018-06-09 MED FILL — LISINOPRIL 2.5 MG TABLET: 2.5 | 30 days supply | Qty: 30 | Fill #1

## 2018-06-09 MED FILL — ?GLIPIZIDE 5MG TABLET: 5 | 30 days supply | Qty: 60 | Fill #2

## 2018-06-09 MED FILL — ?METFORMIN HCL 500MG TABL: 500 | 30 days supply | Qty: 120 | Fill #2

## 2018-06-09 MED FILL — ?PRAVASTATIN NA 40 MG TAB: 40 | 30 days supply | Qty: 30 | Fill #5

## 2018-06-23 ENCOUNTER — Encounter (HOSPITAL_COMMUNITY): Payer: Self-pay | Admitting: Emergency Medicine

## 2018-06-23 ENCOUNTER — Ambulatory Visit: Payer: Self-pay | Attending: Nurse Practitioner | Admitting: Physician Assistant

## 2018-06-23 ENCOUNTER — Ambulatory Visit (HOSPITAL_COMMUNITY)
Admission: EM | Admit: 2018-06-23 | Discharge: 2018-06-23 | Disposition: A | Discharge status: Home / Self Care | Payer: Self-pay

## 2018-06-23 VITALS — BP 119/76 | HR 74 | Temp 98.9°F | Wt 132.4 lb

## 2018-06-23 DIAGNOSIS — Z7984 Long term (current) use of oral hypoglycemic drugs: Secondary | ICD-10-CM | POA: Insufficient documentation

## 2018-06-23 DIAGNOSIS — E1169 Type 2 diabetes mellitus with other specified complication: Secondary | ICD-10-CM

## 2018-06-23 DIAGNOSIS — E118 Type 2 diabetes mellitus with unspecified complications: Secondary | ICD-10-CM | POA: Insufficient documentation

## 2018-06-23 DIAGNOSIS — H5789 Other specified disorders of eye and adnexa: Secondary | ICD-10-CM

## 2018-06-23 DIAGNOSIS — H1012 Acute atopic conjunctivitis, left eye: Secondary | ICD-10-CM | POA: Insufficient documentation

## 2018-06-23 DIAGNOSIS — Z79899 Other long term (current) drug therapy: Secondary | ICD-10-CM | POA: Insufficient documentation

## 2018-06-23 DIAGNOSIS — Z7982 Long term (current) use of aspirin: Secondary | ICD-10-CM | POA: Insufficient documentation

## 2018-06-23 DIAGNOSIS — E785 Hyperlipidemia, unspecified: Secondary | ICD-10-CM | POA: Insufficient documentation

## 2018-06-23 LAB — GLUCOSE, POCT (MANUAL RESULT ENTRY): POC GLUCOSE: 187 mg/dL — AB (ref 70–99)

## 2018-06-23 MED ORDER — FLUTICASONE PROPIONATE 50 MCG/ACT NA SUSP: 2.0000 | Freq: Every day | NASAL | 6 refills | Status: DC

## 2018-06-23 MED ORDER — OLOPATADINE HCL 0.1 % OP SOLN: 1.0000 [drp] | Freq: Two times a day (BID) | OPHTHALMIC | 12 refills | Status: DC

## 2018-06-23 MED FILL — OLOPATADINE HCL 0.1% EYE DR: 0.1 | 37 days supply | Qty: 5 | Fill #0

## 2018-06-23 MED FILL — FLUTICASONE PROP 50 MCG SPR: 50 | 30 days supply | Qty: 16 | Fill #0

## 2018-06-23 NOTE — ED Triage Notes (Signed)
With spanish interpreter, pt states "im here to have an xray because my left eye is swollen since Saturday." Pt states its getting better. Denies injury. Pt states she just started having pain around the eye.

## 2018-06-23 NOTE — Progress Notes (Signed)
Patient ID: Heidi Shaw, female   DOB: 01/30/1971, 47 y.o.   MRN: [id number]     Heidi Shaw, is a 47 y.o. female  PPI:[id number]  AYT:[id number]  DOB - 05/15/1971  Subjective:  Chief Complaint and HPI: Heidi Shaw is a 47 y.o. female here today with her L eye feeling puffy/swollen in the mornings and improves over the course of the day.  There is minimal pain.  No HA.  + itching.  It started 5 days ago when the wind was blowing in her face.  No fever.  No vision changes.  No discharge.  No drainage.  No recent URI.    "Christian" with [employer].    ROS:   Constitutional:  No f/c, No night sweats, No unexplained weight loss. EENT:  No vision changes, No blurry vision, No hearing changes. No mouth, throat, or ear problems.  Respiratory: No cough, No SOB Cardiac: No CP, no palpitations GI:  No abd pain, No N/V/D. GU: No Urinary s/sx Musculoskeletal: No joint pain Neuro: No headache, no dizziness, no motor weakness.  Skin: No rash Endocrine:  No polydipsia. No polyuria.  Psych: Denies SI/HI  No problems updated.  ALLERGIES: No Known Allergies  PAST MEDICAL HISTORY: Past Medical History:  Diagnosis Date  . Anemia   . Diabetes mellitus without complication ([place])   . Heart murmur   . Hyperlipemia     MEDICATIONS AT HOME: Prior to Admission medications   Medication Sig Start Date End Date Taking? Authorizing Provider  aspirin 81 MG tablet Take 1 tablet (81 mg total) by mouth daily. Reported on 08/09/2015 02/16/17  Yes Mikell, Jeani Sow, MD  glipiZIDE (GLUCOTROL) 5 MG tablet Take 0.5 tablet at breakfast, 0.5 tablet at lunch, and 1 (one) tablet at dinner 01/13/18  Yes Gildardo Pounds, NP  lisinopril (ZESTRIL) 2.5 MG tablet Take 1 tablet (2.5 mg total) by mouth daily. 01/13/18  Yes Gildardo Pounds, NP  metFORMIN (GLUCOPHAGE) 500 MG tablet Take 2 tablets (1,000 mg total) by mouth 2 (two) times daily with a meal. 01/13/18  Yes Gildardo Pounds,  NP  pravastatin (PRAVACHOL) 40 MG tablet Take 1 tablet (40 mg total) by mouth daily. 04/23/18 07/22/18 Yes Gildardo Pounds, NP  Blood Glucose Monitoring Suppl (TRUE METRIX METER) w/Device KIT Use to check blood sugars once a day. 06/22/17   Gildardo Pounds, NP  fluticasone (FLONASE) 50 MCG/ACT nasal spray Place 2 sprays into both nostrils daily. 06/23/18   Argentina Donovan, PA-C  glucose blood (TRUE METRIX BLOOD GLUCOSE TEST) test strip Use as instructed 01/13/18   Gildardo Pounds, NP  olopatadine (PATANOL) 0.1 % ophthalmic solution Place 1 drop into the left eye 2 (two) times daily. 06/23/18   Argentina Donovan, PA-C  TRUEPLUS LANCETS 28G MISC Use as prescribed 09/25/17   Gildardo Pounds, NP     Objective:  EXAM:   Vitals:   06/23/18 1033  BP: 119/76  Pulse: 74  Temp: 98.9 F (37.2 C)  TempSrc: Oral  SpO2: 98%  Weight: 132 lb 6.4 oz (60.1 kg)    General appearance : A&OX3. NAD. Non-toxic-appearing HEENT: Atraumatic and Normocephalic.  PERRLA. EOM intact. There is "puffiness" below the L eye.  There is no true swelling or induration.  No sign of abscess.  B conjunctivae w/o erythema but there is slight chemosis in the L eye.  Fundi benign.    TM clear B. Mouth-MMM, post pharynx WNL w/o erythema, No PND. Neck: supple,  no JVD. No cervical lymphadenopathy. No thyromegaly Chest/Lungs:  Breathing-non-labored, Good air entry bilaterally, breath sounds normal without rales, rhonchi, or wheezing  CVS: S1 S2 regular, no murmurs, gallops, rubs  Extremities: Bilateral Lower Ext shows no edema, both legs are warm to touch with = pulse throughout Neurology:  CN II-XII grossly intact, Non focal.   Psych:  TP linear. J/I WNL. Normal speech. Appropriate eye contact and affect.  Skin:  No Rash  Data Review Lab Results  Component Value Date   HGBA1C 6.7 (A) 04/13/2018   HGBA1C 7.2 (A) 01/13/2018   HGBA1C 6.6 06/22/2017     Assessment & Plan   1. Eye swelling, left No sign orbital  cellulitis-CT not indicated at this time.  No nerve entrapment.  Will xray to R/O air-fluid level/sinusitis.   - olopatadine (PATANOL) 0.1 % ophthalmic solution; Place 1 drop into the left eye 2 (two) times daily.  Dispense: 5 mL; Refill: 12 - DG Sinuses Complete; Future  2. Controlled type 2 diabetes mellitus with complication, without long-term current use of insulin ([place]) Uncontrolled-work on diet.  Continue current regimen - Glucose (CBG)  3. Allergic conjunctivitis of left eye - olopatadine (PATANOL) 0.1 % ophthalmic solution; Place 1 drop into the left eye 2 (two) times daily.  Dispense: 5 mL; Refill: 12 - fluticasone (FLONASE) 50 MCG/ACT nasal spray; Place 2 sprays into both nostrils daily.  Dispense: 16 g; Refill: 6  Patient have been counseled extensively about nutrition and exercise  Return for for appt with Zelda in December.  .  The patient was given clear instructions to go to ER or return to medical center if symptoms don't improve, worsen or new problems develop. The patient verbalized understanding. The patient was told to call to get lab results if they haven't heard anything in the next week.     Freeman Caldron, PA-C [facility] and [facility] [place], [place]   06/23/2018, 10:58 AM

## 2018-06-23 NOTE — Progress Notes (Signed)
Discomfort in left eye

## 2018-07-12 MED FILL — ?GLIPIZIDE 5MG TABLET: 5 | 30 days supply | Qty: 60 | Fill #3

## 2018-07-12 MED FILL — TRUE METRIX TEST STRIP: 30 days supply | Qty: 100 | Fill #0

## 2018-07-12 MED FILL — PRAVASTATIN NA 40 MG TAB: 40 | 30 days supply | Qty: 30 | Fill #1

## 2018-07-12 MED FILL — LISINOPRIL 2.5 MG TABLET: 2.5 | 30 days supply | Qty: 30 | Fill #2

## 2018-07-14 ENCOUNTER — Ambulatory Visit: Payer: Self-pay | Attending: Nurse Practitioner | Admitting: Nurse Practitioner

## 2018-07-14 VITALS — BP 98/63 | HR 78 | Temp 98.2°F | Resp 18 | Ht 63.0 in | Wt 135.0 lb

## 2018-07-14 DIAGNOSIS — Z7984 Long term (current) use of oral hypoglycemic drugs: Secondary | ICD-10-CM | POA: Insufficient documentation

## 2018-07-14 DIAGNOSIS — E785 Hyperlipidemia, unspecified: Secondary | ICD-10-CM

## 2018-07-14 DIAGNOSIS — Z833 Family history of diabetes mellitus: Secondary | ICD-10-CM | POA: Insufficient documentation

## 2018-07-14 DIAGNOSIS — E1169 Type 2 diabetes mellitus with other specified complication: Secondary | ICD-10-CM

## 2018-07-14 DIAGNOSIS — E782 Mixed hyperlipidemia: Secondary | ICD-10-CM | POA: Insufficient documentation

## 2018-07-14 DIAGNOSIS — Z7982 Long term (current) use of aspirin: Secondary | ICD-10-CM | POA: Insufficient documentation

## 2018-07-14 DIAGNOSIS — E119 Type 2 diabetes mellitus without complications: Secondary | ICD-10-CM | POA: Insufficient documentation

## 2018-07-14 LAB — GLUCOSE, POCT (MANUAL RESULT ENTRY): POC GLUCOSE: 105 mg/dL — AB (ref 70–99)

## 2018-07-14 MED ORDER — METFORMIN HCL 500 MG PO TABS: 1000.0000 mg | ORAL_TABLET | Freq: Two times a day (BID) | ORAL | 2 refills | Status: DC

## 2018-07-14 MED ORDER — PRAVASTATIN SODIUM 40 MG PO TABS: 40.0000 mg | ORAL_TABLET | Freq: Every day | ORAL | 2 refills | Status: DC

## 2018-07-14 MED ORDER — LISINOPRIL 2.5 MG PO TABS: 2.5000 mg | ORAL_TABLET | Freq: Every day | ORAL | 2 refills | Status: DC

## 2018-07-14 MED ORDER — GLUCOSE BLOOD VI STRP: ORAL_STRIP | 12 refills | Status: DC

## 2018-07-14 MED ORDER — GLIPIZIDE 5 MG PO TABS: 5.0000 mg | ORAL_TABLET | Freq: Two times a day (BID) | ORAL | 3 refills | Status: DC

## 2018-07-14 NOTE — Progress Notes (Signed)
Assessment & Plan:  Heidi Shaw was seen today for follow-up.  Diagnoses and all orders for this visit:  Type 2 diabetes mellitus without complication, without long-term current use of insulin (HCC) -     Glucose (CBG) -     metFORMIN (GLUCOPHAGE) 500 MG tablet; Take 2 tablets (1,000 mg total) by mouth 2 (two) times daily with a meal. -     glucose blood (TRUE METRIX BLOOD GLUCOSE TEST) test strip; Use as instructed -     lisinopril (ZESTRIL) 2.5 MG tablet; Take 1 tablet (2.5 mg total) by mouth daily. -     glipiZIDE (GLUCOTROL) 5 MG tablet; Take 1 tablet (5 mg total) by mouth 2 (two) times daily before a meal. -     CMP14+EGFR -     CBC Continue blood sugar control as discussed in office today, low carbohydrate diet, and regular physical exercise as tolerated, 150 minutes per week (30 min each day, 5 days per week, or 50 min 3 days per week). Keep blood sugar logs with fasting goal of 90-130 mg/dl, post prandial (after you eat) less than 180.  For Hypoglycemia: BS <60 and Hyperglycemia BS >400; contact the clinic ASAP. Annual eye exams and foot exams are recommended.   Hyperlipidemia associated with type 2 diabetes mellitus (HCC) -     pravastatin (PRAVACHOL) 40 MG tablet; Take 1 tablet (40 mg total) by mouth daily. INSTRUCTIONS: Work on a low fat, heart healthy diet and participate in regular aerobic exercise program by working out at least 150 minutes per week; 5 days a week-30 minutes per day. Avoid red meat, fried foods. junk foods, sodas, sugary drinks, unhealthy snacking, alcohol and smoking.  Drink at least 48oz of water per day and monitor your carbohydrate intake daily.     Patient has been counseled on age-appropriate routine health concerns for screening and prevention. These are reviewed and up-to-date. Referrals have been placed accordingly. Immunizations are up-to-date or declined.    Subjective:   Chief Complaint  Patient presents with  . Follow-up   HPI Heidi Shaw 47 y.o. female presents to office today for follow up to DM and mixed hyperlipidemia.    DM TYPE 2 Chronic. Well controlled. Current medication regimen includes glipizide 5 mg BID, Metformin 1000 mg BID. She is taking an ACE (lisinopril 2.5 mg). Monitoring blood glucose levels at home 1-2 times per day. Average Readings: fasting and postprandial; 130-160. Weight is fairly stable. She denies any hypo or hyperglycemic symptoms.  Lab Results  Component Value Date   HGBA1C 6.7 (A) 04/13/2018   Hyperlipidemia Patient presents for follow up to hyperlipidemia.  She is medication compliant taking pravastatin 63m daily. She is not diet compliant and denies statin intolerance including myalgias. LDL not at goal. Fasting lipid panel pending.  Lab Results  Component Value Date   CHOL 199 01/13/2018   Lab Results  Component Value Date   HDL 59 01/13/2018   Lab Results  Component Value Date   LDLCALC 113 (H) 01/13/2018   Lab Results  Component Value Date   TRIG 134 01/13/2018   Lab Results  Component Value Date   CHOLHDL 3.4 01/13/2018    Review of Systems  Constitutional: Negative for fever, malaise/fatigue and weight loss.  HENT: Negative.  Negative for nosebleeds.   Eyes: Negative.  Negative for blurred vision, double vision and photophobia.  Respiratory: Negative.  Negative for cough and shortness of breath.   Cardiovascular: Negative.  Negative for chest pain,  palpitations and leg swelling.  Gastrointestinal: Negative.  Negative for heartburn, nausea and vomiting.  Musculoskeletal: Negative.  Negative for myalgias.  Neurological: Negative.  Negative for dizziness, focal weakness, seizures and headaches.  Endo/Heme/Allergies: Positive for environmental allergies.  Psychiatric/Behavioral: Negative.  Negative for suicidal ideas.    Past Medical History:  Diagnosis Date  . Anemia   . Diabetes mellitus without complication ([place])   . Heart murmur   . Hyperlipemia      Past Surgical History:  Procedure Laterality Date  . CESAREAN SECTION      Family History  Problem Relation Age of Onset  . Diabetes Maternal Uncle   . Diabetes Maternal Uncle     Social History Reviewed with no changes to be made today.   Outpatient Medications Prior to Visit  Medication Sig Dispense Refill  . aspirin 81 MG tablet Take 1 tablet (81 mg total) by mouth daily. Reported on 08/09/2015 90 tablet 4  . Blood Glucose Monitoring Suppl (TRUE METRIX METER) w/Device KIT Use to check blood sugars once a day. 1 kit 0  . fluticasone (FLONASE) 50 MCG/ACT nasal spray Place 2 sprays into both nostrils daily. 16 g 6  . olopatadine (PATANOL) 0.1 % ophthalmic solution Place 1 drop into the left eye 2 (two) times daily. 5 mL 12  . TRUEPLUS LANCETS 28G MISC Use as prescribed 100 each 3  . glipiZIDE (GLUCOTROL) 5 MG tablet Take 0.5 tablet at breakfast, 0.5 tablet at lunch, and 1 (one) tablet at dinner 60 tablet 3  . glucose blood (TRUE METRIX BLOOD GLUCOSE TEST) test strip Use as instructed 100 each 12  . lisinopril (ZESTRIL) 2.5 MG tablet Take 1 tablet (2.5 mg total) by mouth daily. 30 tablet 2  . metFORMIN (GLUCOPHAGE) 500 MG tablet Take 2 tablets (1,000 mg total) by mouth 2 (two) times daily with a meal. 120 tablet 2  . pravastatin (PRAVACHOL) 40 MG tablet Take 1 tablet (40 mg total) by mouth daily. 30 tablet 2   No facility-administered medications prior to visit.     No Known Allergies     Objective:    BP 98/63 (BP Location: Left Arm, Patient Position: Sitting, Cuff Size: Normal)   Pulse 78   Temp 98.2 F (36.8 C) (Oral)   Resp 18   Ht 5' 3"  (1.6 m)   Wt 135 lb (61.2 kg)   SpO2 98%   BMI 23.91 kg/m  Wt Readings from Last 3 Encounters:  07/14/18 135 lb (61.2 kg)  06/23/18 132 lb 6.4 oz (60.1 kg)  04/13/18 132 lb (59.9 kg)    Physical Exam Vitals signs and nursing note reviewed.  Constitutional:      Appearance: She is well-developed.  HENT:     Head:  Normocephalic and atraumatic.  Neck:     Musculoskeletal: Normal range of motion.  Cardiovascular:     Rate and Rhythm: Normal rate and regular rhythm.     Heart sounds: Murmur present. No friction rub. No gallop.   Pulmonary:     Effort: Pulmonary effort is normal. No tachypnea or respiratory distress.     Breath sounds: Normal breath sounds. No decreased breath sounds, wheezing, rhonchi or rales.  Chest:     Chest wall: No tenderness.  Abdominal:     General: Bowel sounds are normal.     Palpations: Abdomen is soft.  Musculoskeletal: Normal range of motion.  Skin:    General: Skin is warm and dry.  Neurological:  Mental Status: She is alert and oriented to person, place, and time.     Coordination: Coordination normal.  Psychiatric:        Behavior: Behavior normal. Behavior is cooperative.        Thought Content: Thought content normal.        Judgment: Judgment normal.          Patient has been counseled extensively about nutrition and exercise as well as the importance of adherence with medications and regular follow-up. The patient was given clear instructions to go to ER or return to medical center if symptoms don't improve, worsen or new problems develop. The patient verbalized understanding.   Follow-up: Return in about 3 months (around 10/13/2018) for DM.   Gildardo Pounds, FNP-BC [facility] and [facility] [place], [place]   07/25/2018, 12:17 PM

## 2018-07-15 LAB — CBC
Hematocrit: 37.9 % (ref 34.0–46.6)
Hemoglobin: 12.6 g/dL (ref 11.1–15.9)
MCH: 27.6 pg (ref 26.6–33.0)
MCHC: 33.2 g/dL (ref 31.5–35.7)
MCV: 83 fL (ref 79–97)
Platelets: 249 10*3/uL (ref 150–450)
RBC: 4.57 x10E6/uL (ref 3.77–5.28)
RDW: 13.9 % (ref 12.3–15.4)
WBC: 7.3 10*3/uL (ref 3.4–10.8)

## 2018-07-15 LAB — CMP14+EGFR
ALT: 15 IU/L (ref 0–32)
AST: 16 IU/L (ref 0–40)
Albumin/Globulin Ratio: 1.9 (ref 1.2–2.2)
Albumin: 4.5 g/dL (ref 3.5–5.5)
Alkaline Phosphatase: 85 IU/L (ref 39–117)
BUN/Creatinine Ratio: 15 (ref 9–23)
BUN: 9 mg/dL (ref 6–24)
Bilirubin Total: 0.3 mg/dL (ref 0.0–1.2)
CO2: 24 mmol/L (ref 20–29)
Calcium: 9.9 mg/dL (ref 8.7–10.2)
Chloride: 105 mmol/L (ref 96–106)
Creatinine, Ser: 0.6 mg/dL (ref 0.57–1.00)
GFR calc Af Amer: 125 mL/min/{1.73_m2} (ref >59)
GFR calc non Af Amer: 108 mL/min/{1.73_m2} (ref >59)
Globulin, Total: 2.4 g/dL (ref 1.5–4.5)
Glucose: 70 mg/dL (ref 65–99)
Potassium: 4.2 mmol/L (ref 3.5–5.2)
Sodium: 142 mmol/L (ref 134–144)
Total Protein: 6.9 g/dL (ref 6.0–8.5)

## 2018-07-25 ENCOUNTER — Encounter: Payer: Self-pay | Admitting: Nurse Practitioner

## 2018-08-03 ENCOUNTER — Telehealth: Payer: Self-pay

## 2018-08-03 NOTE — Telephone Encounter (Signed)
Patient was called and has a voicemail that is not set up at the moment.

## 2018-08-03 NOTE — Telephone Encounter (Signed)
-----   Message from Zelda W Fleming, NP sent at 07/22/2018 12:46 AM EST ----- Please inform patient that laboratory results are normal. Continue healthy eating habit and regular physical exercise at least 3 times a week, 50 minutes each time or 5 days a week for 30 minutes each day 

## 2018-08-10 MED FILL — ?PRAVASTATIN NA 40 MG TAB: 40 | 30 days supply | Qty: 30 | Fill #0

## 2018-08-10 MED FILL — ?METFORMIN HCL 500MG TABLET: 500 | 30 days supply | Qty: 120 | Fill #0

## 2018-08-10 MED FILL — ?GLIPIZIDE 5MG TABLET: 5 | 30 days supply | Qty: 60 | Fill #0

## 2018-08-10 MED FILL — LISINOPRIL 2.5 MG TABLET: 2.5 | 30 days supply | Qty: 30 | Fill #0

## 2018-08-12 ENCOUNTER — Telehealth: Payer: Self-pay

## 2018-08-12 NOTE — Telephone Encounter (Signed)
-----   Message from Claiborne Rigg, NP sent at 07/22/2018 12:46 AM EST ----- Please inform patient that laboratory results are normal. Continue healthy eating habit and regular physical exercise at least 3 times a week, 50 minutes each time or 5 days a week for 30 minutes each day

## 2018-08-12 NOTE — Telephone Encounter (Signed)
Patient was called via interpreter(valerie [phone]) and informed of lab results.

## 2018-08-26 ENCOUNTER — Ambulatory Visit: Payer: Self-pay | Attending: Nurse Practitioner

## 2018-08-30 ENCOUNTER — Telehealth: Payer: Self-pay | Admitting: Nurse Practitioner

## 2018-08-30 NOTE — Telephone Encounter (Signed)
LVM to inform the Pt that we need a copy of her ID, to please bring it to the office to complete the appliacation

## 2018-09-09 MED FILL — ?GLIPIZIDE 5MG TABLET: 5 | 30 days supply | Qty: 60 | Fill #1

## 2018-09-09 MED FILL — ?PRAVASTATIN NA 40 MG TAB: 40 | 30 days supply | Qty: 30 | Fill #1

## 2018-09-09 MED FILL — LISINOPRIL 2.5 MG TABLET: 2.5 | 30 days supply | Qty: 30 | Fill #1

## 2018-09-09 MED FILL — ?METFORMIN HCL 500MG TABL: 500 | 30 days supply | Qty: 120 | Fill #1

## 2018-10-06 MED FILL — ?PRAVASTATIN NA 40 MG TAB: 40 | 30 days supply | Qty: 30 | Fill #2

## 2018-10-06 MED FILL — ?METFORMIN HCL 500MG TABL: 500 | 30 days supply | Qty: 120 | Fill #2

## 2018-10-06 MED FILL — ?GLIPIZIDE 5MG TABLET: 5 | 30 days supply | Qty: 60 | Fill #2

## 2018-10-06 MED FILL — LISINOPRIL 2.5 MG TABLET: 2.5 | 30 days supply | Qty: 30 | Fill #2

## 2018-10-11 ENCOUNTER — Encounter: Payer: Self-pay | Admitting: Nurse Practitioner

## 2018-10-11 ENCOUNTER — Ambulatory Visit: Payer: Self-pay | Attending: Nurse Practitioner | Admitting: Nurse Practitioner

## 2018-10-11 VITALS — BP 116/81 | HR 72 | Temp 98.3°F | Ht 63.0 in | Wt 133.0 lb

## 2018-10-11 DIAGNOSIS — H1013 Acute atopic conjunctivitis, bilateral: Secondary | ICD-10-CM

## 2018-10-11 DIAGNOSIS — S90415D Abrasion, left lesser toe(s), subsequent encounter: Secondary | ICD-10-CM

## 2018-10-11 DIAGNOSIS — E119 Type 2 diabetes mellitus without complications: Secondary | ICD-10-CM

## 2018-10-11 DIAGNOSIS — E1169 Type 2 diabetes mellitus with other specified complication: Secondary | ICD-10-CM

## 2018-10-11 DIAGNOSIS — E785 Hyperlipidemia, unspecified: Secondary | ICD-10-CM

## 2018-10-11 LAB — POCT GLYCOSYLATED HEMOGLOBIN (HGB A1C): Hemoglobin A1C: 6.9 % — AB (ref 4.0–5.6)

## 2018-10-11 LAB — GLUCOSE, POCT (MANUAL RESULT ENTRY): POC Glucose: 149 mg/dl — AB (ref 70–99)

## 2018-10-11 MED ORDER — OLOPATADINE HCL 0.1 % OP SOLN: 1.0000 [drp] | Freq: Two times a day (BID) | OPHTHALMIC | 12 refills | Status: DC

## 2018-10-11 MED ORDER — LISINOPRIL 2.5 MG PO TABS: 2.5000 mg | ORAL_TABLET | Freq: Every day | ORAL | 2 refills | Status: DC

## 2018-10-11 MED ORDER — PRAVASTATIN SODIUM 40 MG PO TABS: 40.0000 mg | ORAL_TABLET | Freq: Every day | ORAL | 1 refills | Status: DC

## 2018-10-11 MED ORDER — METFORMIN HCL 500 MG PO TABS: 1000.0000 mg | ORAL_TABLET | Freq: Two times a day (BID) | ORAL | 1 refills | Status: DC

## 2018-10-11 MED ORDER — GLIPIZIDE 5 MG PO TABS: 5.0000 mg | ORAL_TABLET | Freq: Two times a day (BID) | ORAL | 1 refills | Status: DC

## 2018-10-11 MED ORDER — GLUCOSE BLOOD VI STRP: ORAL_STRIP | 12 refills | Status: DC

## 2018-10-11 MED ORDER — TRUEPLUS LANCETS 28G MISC: 3 refills | Status: DC

## 2018-10-11 MED ORDER — MUPIROCIN CALCIUM 2 % EX CREA: 1.0000 "application " | TOPICAL_CREAM | Freq: Two times a day (BID) | CUTANEOUS | 0 refills | Status: DC

## 2018-10-11 NOTE — Progress Notes (Signed)
Assessment & Plan:  Diagnoses and all orders for this visit:  Type 2 diabetes mellitus without complication, without long-term current use of insulin (HCC) -     Glucose (CBG) -     HgB A1c -     lisinopril (ZESTRIL) 2.5 MG tablet; Take 1 tablet (2.5 mg total) by mouth daily. -     metFORMIN (GLUCOPHAGE) 500 MG tablet; Take 2 tablets (1,000 mg total) by mouth 2 (two) times daily with a meal. -     glucose blood (TRUE METRIX BLOOD GLUCOSE TEST) test strip; Use as instructed -     glipiZIDE (GLUCOTROL) 5 MG tablet; Take 1 tablet (5 mg total) by mouth 2 (two) times daily before a meal. -     TRUEplus Lancets 28G MISC; Use as prescribed Continue blood sugar control as discussed in office today, low carbohydrate diet, and regular physical exercise as tolerated, 150 minutes per week (30 min each day, 5 days per week, or 50 min 3 days per week). Keep blood sugar logs with fasting goal of 90-130 mg/dl, post prandial (after you eat) less than 180.  For Hypoglycemia: BS <60 and Hyperglycemia BS >400; contact the clinic ASAP. Annual eye exams and foot exams are recommended.   Hyperlipidemia associated with type 2 diabetes mellitus (HCC) -     pravastatin (PRAVACHOL) 40 MG tablet; Take 1 tablet (40 mg total) by mouth daily. INSTRUCTIONS: Work on a low fat, heart healthy diet and participate in regular aerobic exercise program by working out at least 150 minutes per week; 5 days a week-30 minutes per day. Avoid red meat, fried foods. junk foods, sodas, sugary drinks, unhealthy snacking, alcohol and smoking.  Drink at least 48oz of water per day and monitor your carbohydrate intake daily.    Allergic conjunctivitis of both eyes -     olopatadine (PATANOL) 0.1 % ophthalmic solution; Place 1 drop into the left eye 2 (two) times daily.  Abrasion of toe of left foot, subsequent encounter -     mupirocin cream (BACTROBAN) 2 %; Apply 1 application topically 2 (two) times daily.    Patient has been  counseled on age-appropriate routine health concerns for screening and prevention. These are reviewed and up-to-date. Referrals have been placed accordingly. Immunizations are up-to-date or declined.    Subjective:  No chief complaint on file.  HPI Heidi Shaw 48 y.o. female presents to office today for follow up to DM. VRI was used to communicate directly with patient for the entire encounter including providing detailed patient instructions.    DM TYPE 2 She has her diabetes log with her today. Checking blood glucose levels daily. Average readings 120-160s. She denies any hypoglycemic symptoms. Hyperglycemic complications include hyperlipidemia. She is overdue for eye exam. Patient has been advised to apply for financial assistance and schedule to see our financial counselor. A1c up from 6.7 to 6.9.  We discussed dietary and exercise modifications today with goal A1c 6.5 or less at next office visit. Lab Results  Component Value Date   HGBA1C 6.9 (A) 10/11/2018    Hyperlipidemia Patient presents for follow up to hyperlipidemia.  She is medication compliant taking pravastatin 40 mg daily. She is not consistently diet compliant and denies chest pain, exertional chest pressure/discomfort and lower extremity edema or statin intolerance including myalgias. Fasting lipid panel pending.. Lab Results  Component Value Date   CHOL 199 01/13/2018   Lab Results  Component Value Date   HDL 59 01/13/2018   Lab  Results  Component Value Date   LDLCALC 113 (H) 01/13/2018   Lab Results  Component Value Date   TRIG 134 01/13/2018   Lab Results  Component Value Date   CHOLHDL 3.4 01/13/2018   No results found for: LDLDIRECT  Review of Systems  Constitutional: Negative for fever, malaise/fatigue and weight loss.  HENT: Negative.  Negative for nosebleeds.   Eyes: Positive for discharge and redness. Negative for blurred vision, double vision, photophobia and pain.  Respiratory:  Negative.  Negative for cough and shortness of breath.   Cardiovascular: Negative.  Negative for chest pain, palpitations and leg swelling.  Gastrointestinal: Negative.  Negative for heartburn, nausea and vomiting.  Musculoskeletal: Negative.  Negative for myalgias.  Neurological: Negative.  Negative for dizziness, focal weakness, seizures and headaches.  Endo/Heme/Allergies: Positive for environmental allergies.  Psychiatric/Behavioral: Negative.  Negative for suicidal ideas.    Past Medical History:  Diagnosis Date  . Anemia   . Diabetes mellitus without complication ([place])   . Heart murmur   . Hyperlipemia     Past Surgical History:  Procedure Laterality Date  . CESAREAN SECTION      Family History  Problem Relation Age of Onset  . Diabetes Maternal Uncle   . Diabetes Maternal Uncle     Social History Reviewed with no changes to be made today.   Outpatient Medications Prior to Visit  Medication Sig Dispense Refill  . aspirin 81 MG tablet Take 1 tablet (81 mg total) by mouth daily. Reported on 08/09/2015 90 tablet 4  . Blood Glucose Monitoring Suppl (TRUE METRIX METER) w/Device KIT Use to check blood sugars once a day. 1 kit 0  . fluticasone (FLONASE) 50 MCG/ACT nasal spray Place 2 sprays into both nostrils daily. 16 g 6  . glucose blood (TRUE METRIX BLOOD GLUCOSE TEST) test strip Use as instructed 100 each 12  . lisinopril (ZESTRIL) 2.5 MG tablet Take 1 tablet (2.5 mg total) by mouth daily. 90 tablet 2  . metFORMIN (GLUCOPHAGE) 500 MG tablet Take 2 tablets (1,000 mg total) by mouth 2 (two) times daily with a meal. 120 tablet 2  . olopatadine (PATANOL) 0.1 % ophthalmic solution Place 1 drop into the left eye 2 (two) times daily. 5 mL 12  . pravastatin (PRAVACHOL) 40 MG tablet Take 1 tablet (40 mg total) by mouth daily. 90 tablet 2  . TRUEPLUS LANCETS 28G MISC Use as prescribed 100 each 3  . glipiZIDE (GLUCOTROL) 5 MG tablet Take 1 tablet (5 mg total) by mouth 2 (two) times  daily before a meal. 60 tablet 3   No facility-administered medications prior to visit.     No Known Allergies     Objective:    BP 116/81 (BP Location: Left Arm, Patient Position: Sitting, Cuff Size: Normal)   Pulse 72   Temp 98.3 F (36.8 C) (Oral)   Ht _0  (1.6 m)   Wt 133 lb (60.3 kg)   SpO2 98%   BMI 23.56 kg/m  Wt Readings from Last 3 Encounters:  10/11/18 133 lb (60.3 kg)  07/14/18 135 lb (61.2 kg)  06/23/18 132 lb 6.4 oz (60.1 kg)    Physical Exam Vitals signs and nursing note reviewed.  Constitutional:      Appearance: She is well-developed.  HENT:     Head: Normocephalic and atraumatic.  Eyes:     General: Lids are normal.        Right eye: No foreign body, discharge or hordeolum.  Left eye: No foreign body, discharge or hordeolum.     Conjunctiva/sclera:     Right eye: Right conjunctiva is injected. No chemosis, exudate or hemorrhage.    Left eye: Left conjunctiva is injected. No chemosis, exudate or hemorrhage.    Pupils: Pupils are equal, round, and reactive to light.  Neck:     Musculoskeletal: Normal range of motion.  Cardiovascular:     Rate and Rhythm: Normal rate and regular rhythm.     Heart sounds: Normal heart sounds. No murmur. No friction rub. No gallop.   Pulmonary:     Effort: Pulmonary effort is normal. No tachypnea or respiratory distress.     Breath sounds: Normal breath sounds. No decreased breath sounds, wheezing, rhonchi or rales.  Chest:     Chest wall: No tenderness.  Abdominal:     General: Bowel sounds are normal.     Palpations: Abdomen is soft.  Musculoskeletal: Normal range of motion.       Feet:  Feet:     Left foot:     Skin integrity: No ulcer, blister, erythema, warmth, callus, dry skin or fissure.  Skin:    General: Skin is warm and dry.  Neurological:     Mental Status: She is alert and oriented to person, place, and time.     Coordination: Coordination normal.  Psychiatric:        Behavior: Behavior  normal. Behavior is cooperative.        Thought Content: Thought content normal.        Judgment: Judgment normal.          Patient has been counseled extensively about nutrition and exercise as well as the importance of adherence with medications and regular follow-up. The patient was given clear instructions to go to ER or return to medical center if symptoms don't improve, worsen or new problems develop. The patient verbalized understanding.   Follow-up: Return for PAP SMEAR .   Gildardo Pounds, FNP-BC [facility] and [place], [place]   10/11/2018, 4:53 PM

## 2018-10-12 ENCOUNTER — Other Ambulatory Visit: Payer: Self-pay | Admitting: Nurse Practitioner

## 2018-10-12 MED ORDER — MUPIROCIN 2 % EX OINT: 1.0000 "application " | TOPICAL_OINTMENT | Freq: Two times a day (BID) | CUTANEOUS | 0 refills | Status: DC

## 2018-10-12 MED FILL — MUPIROCIN 2% OINTMENT: 2 | 10 days supply | Qty: 22 | Fill #0

## 2018-10-12 MED FILL — OLOPATADINE HCL 0.1% EYE DR: 0.1 | 37 days supply | Qty: 5 | Fill #0

## 2018-10-12 MED FILL — TRUE METRIX GLUCOSE TEST ST: 25 days supply | Qty: 100 | Fill #0

## 2018-10-12 MED FILL — TRUEplus LANCETS 28G MISC: 25 days supply | Qty: 100 | Fill #0

## 2018-10-22 MED FILL — metFORMIN HCL 500 MG TABS: 500 | 90 days supply | Qty: 360 | Fill #0

## 2018-10-22 MED FILL — ?PRAVASTATIN NA 40 MG TAB: 40 | 90 days supply | Qty: 90 | Fill #0

## 2018-10-22 MED FILL — LISINOPRIL 2.5 MG TABLET: 2.5 | 90 days supply | Qty: 90 | Fill #0

## 2018-10-22 MED FILL — ?GLIPIZIDE 5MG TABLET: 5 | 90 days supply | Qty: 180 | Fill #0

## 2018-11-24 ENCOUNTER — Ambulatory Visit: Payer: Self-pay | Admitting: Nurse Practitioner

## 2019-01-11 ENCOUNTER — Telehealth: Payer: Self-pay | Admitting: Nurse Practitioner

## 2019-01-11 NOTE — Telephone Encounter (Signed)
Agree as noted

## 2019-01-11 NOTE — Telephone Encounter (Signed)
Patient called stating her coworker tested positive for COVID and wanted to know if she should come in person or not or what she should do advised that her appt will be a TELE visit.

## 2019-01-12 ENCOUNTER — Encounter: Payer: Self-pay | Admitting: Nurse Practitioner

## 2019-01-12 ENCOUNTER — Other Ambulatory Visit: Payer: Self-pay

## 2019-01-12 ENCOUNTER — Ambulatory Visit: Payer: Self-pay | Attending: Nurse Practitioner | Admitting: Nurse Practitioner

## 2019-01-12 DIAGNOSIS — E1169 Type 2 diabetes mellitus with other specified complication: Secondary | ICD-10-CM

## 2019-01-12 DIAGNOSIS — Z20828 Contact with and (suspected) exposure to other viral communicable diseases: Secondary | ICD-10-CM

## 2019-01-12 DIAGNOSIS — E119 Type 2 diabetes mellitus without complications: Secondary | ICD-10-CM

## 2019-01-12 DIAGNOSIS — E785 Hyperlipidemia, unspecified: Secondary | ICD-10-CM

## 2019-01-12 MED ORDER — LISINOPRIL 2.5 MG PO TABS: 2.5000 mg | ORAL_TABLET | Freq: Every day | ORAL | 2 refills | Status: DC

## 2019-01-12 MED ORDER — GLUCOSE BLOOD VI STRP: ORAL_STRIP | 12 refills | Status: DC

## 2019-01-12 MED ORDER — PRAVASTATIN SODIUM 40 MG PO TABS: 40.0000 mg | ORAL_TABLET | Freq: Every day | ORAL | 1 refills | Status: DC

## 2019-01-12 MED ORDER — METFORMIN HCL 500 MG PO TABS: 1000.0000 mg | ORAL_TABLET | Freq: Two times a day (BID) | ORAL | 1 refills | Status: DC

## 2019-01-12 MED ORDER — GLIPIZIDE 5 MG PO TABS: 5.0000 mg | ORAL_TABLET | Freq: Two times a day (BID) | ORAL | 0 refills | Status: DC

## 2019-01-12 NOTE — Progress Notes (Signed)
Virtual Visit via Telephone Note Due to national recommendations of social distancing due to COVID 19, telehealth visit is felt to be most appropriate for this patient at this time.  I discussed the limitations, risks, security and privacy concerns of performing an evaluation and management service by telephone and the availability of in person appointments. I also discussed with the patient that there may be a patient responsible charge related to this service. The patient expressed understanding and agreed to proceed.    I connected with Heidi LarsenMaria Shaw on 01/12/19  at   4:10 PM EDT  EDT by telephone and verified that I am speaking with the correct person using two identifiers.   Consent I discussed the limitations, risks, security and privacy concerns of performing an evaluation and management service by telephone and the availability of in person appointments. I also discussed with the patient that there may be a patient responsible charge related to this service. The patient expressed understanding and agreed to proceed.   Location of Patient: Private  Residence   [facility]    Persons participating in Telemedicine visit: Bertram DenverZelda Fleming FNP-BC [facility] Heidi LarsenMaria Shaw  Spanish ID # Mikle BosworthCarlos [id number]   History of Present Illness: Telemedicine visit for: DM TYPE 2. States one of her coworkers tested positive for COVID 19. She is going for testing tomorrow.   has a past medical history of Anemia, Diabetes mellitus without complication (HCC), Heart murmur, and Hyperlipemia.    DM TYPE 2 Chronic and well controlled. Monitoring blood glucose levels twice per day. Average readings Fasting 110s and post prandial 140-150's.  Taking glipizide 5 mg BID, metformin 1000 mg BID. A1C at goal. On Statin and low dose ACE. Denies any hypo or hyperglycemic symptoms. Overdue for eye exam. Patient has been advised to apply for financial  assistance and schedule to see our financial counselor. LDL is not at goal. Blood pressure well controlled.  Lab Results  Component Value Date   HGBA1C 6.9 (A) 10/11/2018    Mixed Hyperlipidemia Endorses medication compliance taking pravastatin 40 mg daily. Denies statin intolerance or myalgias.  Lab Results  Component Value Date   LDLCALC 113 (H) 01/13/2018   BP Readings from Last 3 Encounters:  10/11/18 116/81  07/14/18 98/63  06/23/18 125/68    Past Medical History:  Diagnosis Date  . Anemia   . Diabetes mellitus without complication (HCC)   . Heart murmur   . Hyperlipemia     Past Surgical History:  Procedure Laterality Date  . CESAREAN SECTION      Family History  Problem Relation Age of Onset  . Diabetes Maternal Uncle   . Diabetes Maternal Uncle     Social History   Socioeconomic History  . Marital status: Single    Spouse name: Not on file  . Number of children: 4  . Years of education: Not on file  . Highest education level: Not on file  Occupational History  . 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 Financial resource strain: Not on file  . Food insecurity:    Worry: Not on file    Inability: Not on file  . Transportation needs:    Medical: Not on file    Non-medical: Not on file  Tobacco Use  . Smoking status: Never Smoker  . Smokeless tobacco: Never Used  Substance and Sexual Activity  . Alcohol use: No  . Drug use: No  . Sexual  activity: Yes    Birth control/protection: Implant  Lifestyle  . Physical activity:    Days per week: Not on file    Minutes per session: Not on file  . Stress: Not on file  Relationships  . Social connections:    Talks on phone: Not on file    Gets together: Not on file    Attends religious service: Not on file    Active member of club or organization: Not on file    Attends meetings of clubs or organizations: Not on file    Relationship status: Not on file  Other Topics Concern  . Not on file   Social History Narrative  . Not on file     Observations/Objective: Awake, alert and oriented x 3   Review of Systems  Constitutional: Negative for fever, malaise/fatigue and weight loss.  HENT: Negative.  Negative for nosebleeds.   Eyes: Negative.  Negative for blurred vision, double vision and photophobia.  Respiratory: Negative.  Negative for cough and shortness of breath.   Cardiovascular: Negative.  Negative for chest pain, palpitations and leg swelling.  Gastrointestinal: Negative.  Negative for heartburn, nausea and vomiting.  Musculoskeletal: Negative.  Negative for myalgias.  Neurological: Negative.  Negative for dizziness, focal weakness, seizures and headaches.  Psychiatric/Behavioral: Negative.  Negative for suicidal ideas.    Assessment and Plan: Kismet was seen today for medication refill.  Diagnoses and all orders for this visit:  Type 2 diabetes mellitus without complication, without long-term current use of insulin (HCC) -     metFORMIN (GLUCOPHAGE) 500 MG tablet; Take 2 tablets (1,000 mg total) by mouth 2 (two) times daily with a meal. -     lisinopril (ZESTRIL) 2.5 MG tablet; Take 1 tablet (2.5 mg total) by mouth daily. -     glucose blood (TRUE METRIX BLOOD GLUCOSE TEST) test strip; Use as instructed -     glipiZIDE (GLUCOTROL) 5 MG tablet; Take 1 tablet (5 mg total) by mouth 2 (two) times daily before a meal.  Hyperlipidemia associated with type 2 diabetes mellitus (HCC) -     pravastatin (PRAVACHOL) 40 MG tablet; Take 1 tablet (40 mg total) by mouth daily.     Follow Up Instructions Return in about 3 months (around 04/15/2019).     I discussed the assessment and treatment plan with the patient. The patient was provided an opportunity to ask questions and all were answered. The patient agreed with the plan and demonstrated an understanding of the instructions.   The patient was advised to call back or seek an in-person evaluation if the symptoms worsen or  if the condition fails to improve as anticipated.  I provided 19 minutes of non-face-to-face time during this encounter including median intraservice time, reviewing previous notes, labs, imaging, medications and explaining diagnosis and management.  Gildardo Pounds, FNP-BC

## 2019-01-13 MED FILL — ?METFORMIN HCL 500MG TABLET: 500 | 90 days supply | Qty: 360 | Fill #0

## 2019-01-13 MED FILL — LISINOPRIL 2.5 MG TABLET: 2.5 | 90 days supply | Qty: 90 | Fill #0

## 2019-01-13 MED FILL — ?PRAVASTATIN NA 40 MG TAB: 40 | 90 days supply | Qty: 90 | Fill #0

## 2019-01-13 MED FILL — ?GLIPIZIDE 5MG TABLET: 5 | 90 days supply | Qty: 180 | Fill #0

## 2019-01-13 MED FILL — TRUE METRIX TEST STRIP: 30 days supply | Qty: 100 | Fill #0

## 2019-01-21 ENCOUNTER — Ambulatory Visit: Payer: Self-pay | Admitting: Nurse Practitioner

## 2019-04-15 ENCOUNTER — Other Ambulatory Visit: Payer: Self-pay

## 2019-04-15 ENCOUNTER — Ambulatory Visit (HOSPITAL_BASED_OUTPATIENT_CLINIC_OR_DEPARTMENT_OTHER): Payer: Self-pay | Admitting: Pharmacist

## 2019-04-15 ENCOUNTER — Encounter: Payer: Self-pay | Admitting: Nurse Practitioner

## 2019-04-15 ENCOUNTER — Ambulatory Visit: Payer: Self-pay | Attending: Nurse Practitioner | Admitting: Nurse Practitioner

## 2019-04-15 VITALS — BP 126/79 | HR 78 | Temp 98.4°F | Ht 63.0 in | Wt 133.0 lb

## 2019-04-15 DIAGNOSIS — Z23 Encounter for immunization: Secondary | ICD-10-CM

## 2019-04-15 DIAGNOSIS — H1012 Acute atopic conjunctivitis, left eye: Secondary | ICD-10-CM

## 2019-04-15 DIAGNOSIS — E119 Type 2 diabetes mellitus without complications: Secondary | ICD-10-CM

## 2019-04-15 DIAGNOSIS — J302 Other seasonal allergic rhinitis: Secondary | ICD-10-CM

## 2019-04-15 DIAGNOSIS — E785 Hyperlipidemia, unspecified: Secondary | ICD-10-CM

## 2019-04-15 DIAGNOSIS — E1169 Type 2 diabetes mellitus with other specified complication: Secondary | ICD-10-CM

## 2019-04-15 LAB — POCT GLYCOSYLATED HEMOGLOBIN (HGB A1C): Hemoglobin A1C: 7.1 % — AB (ref 4.0–5.6)

## 2019-04-15 LAB — GLUCOSE, POCT (MANUAL RESULT ENTRY): POC Glucose: 108 mg/dl — AB (ref 70–99)

## 2019-04-15 MED ORDER — LISINOPRIL 2.5 MG PO TABS: 2.5000 mg | ORAL_TABLET | Freq: Every day | ORAL | 2 refills | Status: DC

## 2019-04-15 MED ORDER — PRAVASTATIN SODIUM 40 MG PO TABS: 40.0000 mg | ORAL_TABLET | Freq: Every day | ORAL | 1 refills | Status: DC

## 2019-04-15 MED ORDER — TRUE METRIX BLOOD GLUCOSE TEST VI STRP: ORAL_STRIP | 12 refills | Status: DC

## 2019-04-15 MED ORDER — FLUTICASONE PROPIONATE 50 MCG/ACT NA SUSP: 2.0000 | Freq: Every day | NASAL | 6 refills | Status: DC

## 2019-04-15 MED ORDER — METFORMIN HCL 500 MG PO TABS: 1000.0000 mg | ORAL_TABLET | Freq: Two times a day (BID) | ORAL | 1 refills | Status: DC

## 2019-04-15 MED ORDER — OLOPATADINE HCL 0.1 % OP SOLN: 1.0000 [drp] | Freq: Two times a day (BID) | OPHTHALMIC | 12 refills | Status: AC

## 2019-04-15 MED ORDER — TRUEPLUS LANCETS 28G MISC: 6 refills | Status: DC

## 2019-04-15 MED ORDER — GLIPIZIDE 5 MG PO TABS: 5.0000 mg | ORAL_TABLET | Freq: Two times a day (BID) | ORAL | 0 refills | Status: DC

## 2019-04-15 MED FILL — TRUEplus LANCETS 28G MISC: 25 days supply | Qty: 100 | Fill #0

## 2019-04-15 MED FILL — ?GLIPIZIDE 5MG TABLET: 5 | 90 days supply | Qty: 180 | Fill #0

## 2019-04-15 MED FILL — LISINOPRIL 2.5 MG TABLET: 2.5 | 90 days supply | Qty: 90 | Fill #0

## 2019-04-15 MED FILL — OLOPATADINE HCL 0.1 % SOLN: 0.1 | 37 days supply | Qty: 5 | Fill #0

## 2019-04-15 MED FILL — ?METFORMIN HCL 500MG TABLET: 500 | 90 days supply | Qty: 360 | Fill #0

## 2019-04-15 MED FILL — TRUE METRIX TEST STRIP: 25 days supply | Qty: 100 | Fill #0

## 2019-04-15 MED FILL — FLUTICASONE PROP 50 MCG SPR: 50 | 30 days supply | Qty: 16 | Fill #0

## 2019-04-15 MED FILL — ?PRAVASTATIN NA 40 MG TAB: 40 | 90 days supply | Qty: 90 | Fill #0

## 2019-04-15 NOTE — Progress Notes (Signed)
Assessment & Plan:  Antonae was seen today for follow-up.  Diagnoses and all orders for this visit:  Type 2 diabetes mellitus without complication, without long-term current use of insulin (HCC) -     Glucose (CBG) -     HgB A1c -     glipiZIDE (GLUCOTROL) 5 MG tablet; Take 1 tablet (5 mg total) by mouth 2 (two) times daily before a meal. -     glucose blood (TRUE METRIX BLOOD GLUCOSE TEST) test strip; Use as instructed. Please fill as a 90 day supply -     lisinopril (ZESTRIL) 2.5 MG tablet; Take 1 tablet (2.5 mg total) by mouth daily. -     metFORMIN (GLUCOPHAGE) 500 MG tablet; Take 2 tablets (1,000 mg total) by mouth 2 (two) times daily with a meal. -     TRUEplus Lancets 28G MISC; Use as prescribed. Please fill as a 90 day supply -     CMP14+EGFR -     Ambulatory referral to Ophthalmology  Continue medications as prescribed.  Continue blood sugar control as discussed in office today, low carbohydrate diet, and regular physical exercise as tolerated, 150 minutes per week (30 min each day, 5 days per week, or 50 min 3 days per week). Keep blood sugar logs with fasting goal of 90-130 mg/dl, post prandial (after you eat) less than 180.  For Hypoglycemia: BS <60 and Hyperglycemia BS >400; contact the clinic ASAP. Annual eye exams and foot exams are recommended.  Hyperlipidemia associated with type 2 diabetes mellitus (HCC) -     pravastatin (PRAVACHOL) 40 MG tablet; Take 1 tablet (40 mg total) by mouth daily. -     Lipid panel INSTRUCTIONS: Work on a low fat, heart healthy diet and participate in regular aerobic exercise program by working out at least 150 minutes per week; 5 days a week-30 minutes per day. Avoid red meat, fried foods. junk foods, sodas, sugary drinks, unhealthy snacking, alcohol and smoking.  Drink at least 48oz of water per day and monitor your carbohydrate intake daily.    Allergic conjunctivitis of left eye -     fluticasone (FLONASE) 50 MCG/ACT nasal spray; Place 2  sprays into both nostrils daily.  Seasonal allergies -     olopatadine (PATANOL) 0.1 % ophthalmic solution; Place 1 drop into the left eye 2 (two) times daily.    Patient has been counseled on age-appropriate routine health concerns for screening and prevention. These are reviewed and up-to-date. Referrals have been placed accordingly. Immunizations are up-to-date or declined.    Subjective:   Chief Complaint  Patient presents with  . Follow-up    Pt. is here to follow up on diabetes.    HPI Heidi Shaw 48 y.o. female presents to office today for follow up to DM.    DM TYPE 2 Chronic and A1c slightly worsened today.   Average fasting readings: 140-170s. She has not been active with her walking routine over the past several months due to [place]. Her diet is fairly consistent she tries to avoid excessive carbs.  She states she would like to restart her walking routine over the next 3 months and avoid increasing any of her diabetic medications at this time.  Currently taking glipizide 5 mg twice daily and metformin 1000 mg twice daily.  Taking low-dose ACE and statin.  She denies any symptoms of hypo-or hypoxemia.  She is overdue for eye exam and will be referred to ophthalmology today. Lab Results  Component Value  Date   HGBA1C 7.1 (A) 04/15/2019   Lab Results  Component Value Date   HGBA1C 6.9 (A) 10/11/2018    Dyslipidemia LDL not at goal of <70.  She endorses medication compliance taking pravastatin 40 mg daily and denies any statin intolerance.  Repeat lipid panel pending. Lab Results  Component Value Date   LDLCALC 113 (H) 01/13/2018   Review of Systems  Constitutional: Negative for fever, malaise/fatigue and weight loss.  HENT: Negative.  Negative for nosebleeds.   Eyes: Negative.  Negative for blurred vision, double vision, photophobia, pain, discharge and redness.  Respiratory: Negative.  Negative for cough and shortness of breath.   Cardiovascular: Negative.   Negative for chest pain, palpitations and leg swelling.  Gastrointestinal: Negative.  Negative for heartburn, nausea and vomiting.  Musculoskeletal: Negative.  Negative for myalgias.  Neurological: Negative.  Negative for dizziness, focal weakness, seizures and headaches.  Endo/Heme/Allergies: Positive for environmental allergies.  Psychiatric/Behavioral: Negative.  Negative for suicidal ideas.    Past Medical History:  Diagnosis Date  . Anemia   . Diabetes mellitus without complication ([place])   . Heart murmur   . Hyperlipemia     Past Surgical History:  Procedure Laterality Date  . CESAREAN SECTION      Family History  Problem Relation Age of Onset  . Diabetes Maternal Uncle   . Diabetes Maternal Uncle     Social History Reviewed with no changes to be made today.   Outpatient Medications Prior to Visit  Medication Sig Dispense Refill  . aspirin 81 MG tablet Take 1 tablet (81 mg total) by mouth daily. Reported on 08/09/2015 90 tablet 4  . Blood Glucose Monitoring Suppl (TRUE METRIX METER) w/Device KIT Use to check blood sugars once a day. 1 kit 0  . mupirocin ointment (BACTROBAN) 2 % Apply 1 application topically 2 (two) times daily. 30 g 0  . fluticasone (FLONASE) 50 MCG/ACT nasal spray Place 2 sprays into both nostrils daily. 16 g 6  . glucose blood (TRUE METRIX BLOOD GLUCOSE TEST) test strip Use as instructed 100 each 12  . lisinopril (ZESTRIL) 2.5 MG tablet Take 1 tablet (2.5 mg total) by mouth daily. 90 tablet 2  . olopatadine (PATANOL) 0.1 % ophthalmic solution Place 1 drop into the left eye 2 (two) times daily. 5 mL 12  . TRUEplus Lancets 28G MISC Use as prescribed 100 each 3  . glipiZIDE (GLUCOTROL) 5 MG tablet Take 1 tablet (5 mg total) by mouth 2 (two) times daily before a meal. 180 tablet 0  . metFORMIN (GLUCOPHAGE) 500 MG tablet Take 2 tablets (1,000 mg total) by mouth 2 (two) times daily with a meal. 360 tablet 1  . pravastatin (PRAVACHOL) 40 MG tablet Take 1 tablet  (40 mg total) by mouth daily. 90 tablet 1   No facility-administered medications prior to visit.     No Known Allergies     Objective:    BP 126/79 (BP Location: Left Arm, Patient Position: Sitting, Cuff Size: Normal)   Pulse 78   Temp 98.4 F (36.9 C) (Oral)   Ht 5' 3"  (1.6 m)   Wt 133 lb (60.3 kg)   SpO2 97%   BMI 23.56 kg/m  Wt Readings from Last 3 Encounters:  04/15/19 133 lb (60.3 kg)  10/11/18 133 lb (60.3 kg)  07/14/18 135 lb (61.2 kg)    Physical Exam Vitals signs and nursing note reviewed.  Constitutional:      Appearance: She is well-developed.  HENT:  Head: Normocephalic and atraumatic.     Right Ear: Hearing, tympanic membrane, ear canal and external ear normal.     Left Ear: Hearing, tympanic membrane, ear canal and external ear normal.  Eyes:     General: Lids are normal. Gaze aligned appropriately.     Conjunctiva/sclera:     Right eye: Right conjunctiva is not injected. No chemosis, exudate or hemorrhage.    Left eye: Left conjunctiva is not injected. No chemosis, exudate or hemorrhage. Neck:     Musculoskeletal: Normal range of motion.  Cardiovascular:     Rate and Rhythm: Normal rate and regular rhythm.     Heart sounds: Normal heart sounds. No murmur. No friction rub. No gallop.   Pulmonary:     Effort: Pulmonary effort is normal. No tachypnea or respiratory distress.     Breath sounds: Normal breath sounds. No decreased breath sounds, wheezing, rhonchi or rales.  Chest:     Chest wall: No tenderness.  Abdominal:     General: Bowel sounds are normal.     Palpations: Abdomen is soft.  Musculoskeletal: Normal range of motion.  Skin:    General: Skin is warm and dry.  Neurological:     Mental Status: She is alert and oriented to person, place, and time.     Coordination: Coordination normal.  Psychiatric:        Behavior: Behavior normal. Behavior is cooperative.        Thought Content: Thought content normal.        Judgment: Judgment  normal.          Patient has been counseled extensively about nutrition and exercise as well as the importance of adherence with medications and regular follow-up. The patient was given clear instructions to go to ER or return to medical center if symptoms don't improve, worsen or new problems develop. The patient verbalized understanding.   Follow-up: Return for PAP SMEAR.   Gildardo Pounds, FNP-BC [facility] [place], [place]   04/15/2019, 5:46 PM

## 2019-04-15 NOTE — Progress Notes (Signed)
Patient presents for vaccination against influenza per orders of Zelda. Consent given. Counseling provided. No contraindications exists. Vaccine administered without incident.   

## 2019-04-16 LAB — CMP14+EGFR
ALT: 19 IU/L (ref 0–32)
AST: 18 IU/L (ref 0–40)
Albumin/Globulin Ratio: 1.9 (ref 1.2–2.2)
Albumin: 4.5 g/dL (ref 3.8–4.8)
Alkaline Phosphatase: 88 IU/L (ref 39–117)
BUN/Creatinine Ratio: 11 (ref 9–23)
BUN: 7 mg/dL (ref 6–24)
Bilirubin Total: 0.2 mg/dL (ref 0.0–1.2)
CO2: 22 mmol/L (ref 20–29)
Calcium: 9.2 mg/dL (ref 8.7–10.2)
Chloride: 104 mmol/L (ref 96–106)
Creatinine, Ser: 0.64 mg/dL (ref 0.57–1.00)
GFR calc Af Amer: 122 mL/min/{1.73_m2} (ref >59)
GFR calc non Af Amer: 106 mL/min/{1.73_m2} (ref >59)
Globulin, Total: 2.4 g/dL (ref 1.5–4.5)
Glucose: 95 mg/dL (ref 65–99)
Potassium: 3.9 mmol/L (ref 3.5–5.2)
Sodium: 139 mmol/L (ref 134–144)
Total Protein: 6.9 g/dL (ref 6.0–8.5)

## 2019-04-16 LAB — LIPID PANEL
Chol/HDL Ratio: 3.8 ratio (ref 0.0–4.4)
Cholesterol, Total: 173 mg/dL (ref 100–199)
HDL: 45 mg/dL (ref >39)
LDL Chol Calc (NIH): 99 mg/dL (ref 0–99)
Triglycerides: 164 mg/dL — ABNORMAL HIGH (ref 0–149)
VLDL Cholesterol Cal: 29 mg/dL (ref 5–40)

## 2019-04-21 ENCOUNTER — Other Ambulatory Visit: Payer: Self-pay | Admitting: Nurse Practitioner

## 2019-04-21 DIAGNOSIS — Z1231 Encounter for screening mammogram for malignant neoplasm of breast: Secondary | ICD-10-CM

## 2019-05-20 ENCOUNTER — Encounter: Payer: Self-pay | Admitting: Nurse Practitioner

## 2019-05-20 ENCOUNTER — Ambulatory Visit: Payer: Self-pay | Attending: Nurse Practitioner | Admitting: Nurse Practitioner

## 2019-05-20 ENCOUNTER — Other Ambulatory Visit: Payer: Self-pay

## 2019-05-20 VITALS — BP 113/75 | HR 75 | Temp 98.2°F | Ht 63.0 in | Wt 132.0 lb

## 2019-05-20 DIAGNOSIS — Z124 Encounter for screening for malignant neoplasm of cervix: Secondary | ICD-10-CM

## 2019-05-20 DIAGNOSIS — E119 Type 2 diabetes mellitus without complications: Secondary | ICD-10-CM

## 2019-05-20 DIAGNOSIS — N819 Female genital prolapse, unspecified: Secondary | ICD-10-CM

## 2019-05-20 LAB — GLUCOSE, POCT (MANUAL RESULT ENTRY): POC Glucose: 116 mg/dl — AB (ref 70–99)

## 2019-05-20 NOTE — Patient Instructions (Signed)
Prolapso de los rganos de la pelvis Pelvic Organ Prolapse Un prolapso de los rganos de la pelvis es el estiramiento, la dilatacin o la cada de los rganos de la pelvis a una posicin anormal. Esto sucede cuando los msculos y tejidos que rodean y sostienen las estructuras plvicas se debilitan o estiran. El prolapso de los rganos de la pelvis puede involucrar a los siguientes rganos:  Vagina (prolapso vaginal).  tero (prolapso uterino).  Vejiga (cistocele).  Recto (rectocele).  Intestinos (enterocele). Cuando estn comprometidos rganos diferentes a la vagina, estos a menudo se protruyen hacia adentro de la vagina o hacia afuera de la vagina, segn la gravedad del prolapso. Cules son las causas? Esta afeccin puede ser causada por lo siguiente:  Embarazo, trabajo de parto y parto.  Ciruga de pelvis anterior.  Una disminucin de la produccin de estrgeno debido a la menopausia.  Levantar sistemticamente objetos que pesan ms de 50libras (23kg).  Obesidad.  Problemas para defecar a largo plazo (estreimiento crnico).  Una tos que dura mucho tiempo (crnica).  Acumulacin de lquido en el abdomen debido a ciertas enfermedades y afecciones. Cules son los signos o los sntomas? Los sntomas de esta afeccin incluyen los siguientes:  Escape de orina (prdida del control de la vejiga) al toser, estornudar, esforzarse y ejercitar (incontinencia urinaria de esfuerzo). Esto puede empeorar inmediatamente despus del parto. Puede mejorar gradualmente con el tiempo.  Sensacin de presin en la pelvis o la vagina. Esta presin puede aumentar al toser o al defecar.  Un bulto que sobresale de la abertura de la vagina.  Dificultad para orinar o defecar.  Dolor en la parte inferior de la espalda.  Dolor, molestias o desinters en tener sexo.  Infecciones reiteradas en la vejiga (infecciones de las vas urinarias).  Dificultad para insertar un tampn. En algunas  personas, esta afeccin no causa sntomas. Cmo se diagnostica? Esta afeccin puede diagnosticarse con un examen de la vagina y un examen rectal. Durante el examen, pueden pedirle que tosa y que haga un esfuerzo mientras est acostada, sentada y de pie. El mdico determinar si se requieren otros estudios, como las pruebas de la funcin de la vejiga. Cmo se trata? El tratamiento de esta afeccin puede depender de los sntomas. El tratamiento puede incluir lo siguiente:  Cambios en el estilo de vida, como modificar su dieta.  Vaciar la vejiga en horarios programados (terapia de entrenamiento de la vejiga). Esto puede ayudar a reducir o evitar la incontinencia urinaria.  Estrgeno. Si el prolapso es leve, el estrgeno puede ayudar al aumentar la fuerza y el tono de los msculos del piso plvico.  Ejercicios de Kegel. Estos ejercicios pueden ayudar en los casos leves de prolapso al estirar y tensar los msculos del piso plvico.  Un dispositivo flexible que ayuda a sostener las paredes vaginales y mantener los rganos de la pelvis en su lugar (pesario). El mdico insertar este dispositivo en la vagina.  Ciruga. Esta es a menudo la nica forma de tratamiento de los casos graves de prolapso. Siga estas indicaciones en su casa:  Evite ingerir bebidas que contengan cafena o alcohol.  Aumente el consumo de alimentos ricos en fibra. Esto puede ayudar a disminuir el estreimiento y a evitar realizar esfuerzos durante las deposiciones.  Pierda peso si se lo recomienda el mdico.  Use una toalla higinica o paales para adultos si tiene incontinencia urinaria.  Evite levantar objetos pesados y realizar esfuerzos mientras se ejercita o trabaja. No contenga la respiracin cuando levante pesas   y realice ejercicios de intensidad leve a moderada. Limite sus actividades como se lo haya indicado el mdico.  Realice los ejercicios de Kegel como se lo haya indicado el mdico. Haga lo siguiente: ?  Contraiga con fuerza los msculos del suelo plvico. Debe sentir que se eleva y comprime el rea rectal y tensin en el rea vaginal. Mantenga el estmago, las nalgas y las piernas [place]. ? Mantenga los msculos tensos durante 10segundos como mximo. ? Relaje los msculos. ? Repita este ejercicio 50veces [place] veces que le haya indicado su mdico. Contine haciendo este ejercicio durante al menos 4 a 6semanas o el tiempo que le haya indicado su mdico.  Tome los medicamentos de venta libre y los recetados solamente como se lo haya indicado el mdico.  Si tiene puesto un pesario, cudelo como se lo haya indicado su mdico.  Concurra a todas las visitas de seguimiento como se lo haya indicado el mdico. Esto es importante. Comunquese con un mdico si:  Tiene sntomas que interfieren con sus actividades diarias o su vida sexual.  Necesita medicamentos para [employer].  Observa un sangrado proveniente de la vagina no relacionado con su menstruacin.  Tiene fiebre.  Siente dolor o sangra al [employer].  Sangra al defecar.  Pierde orina [employer].  Tiene estreimiento crnico.  Su pesario se Diplomatic Services operational officer.  Tiene secrecin vaginal con mal olor.  Tiene un dolor bajo e inusual en el abdomen. Resumen  Un prolapso de los rganos de la pelvis es el estiramiento, la dilatacin o la cada de los rganos de la pelvis a una posicin anormal. Esto sucede cuando los msculos y tejidos que rodean y sostienen las estructuras plvicas se debilitan o estiran.  Cuando estn comprometidos rganos diferentes a la vagina, estos a menudo se protruyen hacia adentro de la vagina o hacia afuera de la vagina, segn la gravedad del prolapso.  En la [employer], esta afeccin debe tratarse solo si produce sntomas. El tratamiento puede incluir cambios en el estilo de vida, estrgeno, ejercicios de Kegel, la insercin de un pesario o [place].  Evite levantar objetos  pesados y Armed forces training and education officer se ejercita o trabaja. No contenga la respiracin cuando levante pesas y realice ejercicios de intensidad leve a moderada. Limite sus actividades como se lo haya indicado el mdico. Esta informacin no tiene Marine scientist el consejo del mdico. Asegrese de hacerle al mdico cualquier pregunta que tenga. Document Released: 02/15/2014 Document Revised: 10/01/2017 Document Reviewed: 10/01/2017 Elsevier Patient Education  2020 [employer].

## 2019-05-20 NOTE — Progress Notes (Signed)
Assessment & Plan:  Gertude was seen today for gynecologic exam.  Diagnoses and all orders for this visit:  Encounter for Papanicolaou smear for cervical cancer screening -     Cytology - PAP -     Cervicovaginal ancillary only -     Ambulatory referral to Gynecology  Type 2 diabetes mellitus without complication, without long-term current use of insulin (HCC) -     Glucose (CBG)  Female genital prolapse, unspecified type -     Ambulatory referral to Gynecology    Patient has been counseled on age-appropriate routine health concerns for screening and prevention. These are reviewed and up-to-date. Referrals have been placed accordingly. Immunizations are up-to-date or declined.    Subjective:   Chief Complaint  Patient presents with  . Gynecologic Exam    Pt. is here for a pap smear.    HPI Heidi Shaw 48 y.o. female presents to office today for PAP. Unfortunately I was unable to complete PAP today due to prolapse. She states she feels pressure in her vaginal area and has been feeling this since the birth of her son 16 years ago. She mentioned the pressure sensation to Gynecology at that time and states they told her it was okay that time.     Review of Systems  Constitutional: Negative.  Negative for chills, fever, malaise/fatigue and weight loss.  Respiratory: Negative.  Negative for cough, shortness of breath and wheezing.   Cardiovascular: Negative.  Negative for chest pain, orthopnea and leg swelling.  Gastrointestinal: Negative for abdominal pain.  Genitourinary: Negative.  Negative for flank pain.  Skin: Negative.  Negative for rash.  Psychiatric/Behavioral: Negative for suicidal ideas.    Past Medical History:  Diagnosis Date  . Anemia   . Diabetes mellitus without complication ([place])   . Heart murmur   . Hyperlipemia     Past Surgical History:  Procedure Laterality Date  . CESAREAN SECTION      Family History  Problem Relation Age of Onset  .  Diabetes Maternal Uncle   . Diabetes Maternal Uncle     Social History Reviewed with no changes to be made today.   Outpatient Medications Prior to Visit  Medication Sig Dispense Refill  . aspirin 81 MG tablet Take 1 tablet (81 mg total) by mouth daily. Reported on 08/09/2015 90 tablet 4  . Blood Glucose Monitoring Suppl (TRUE METRIX METER) w/Device KIT Use to check blood sugars once a day. 1 kit 0  . fluticasone (FLONASE) 50 MCG/ACT nasal spray Place 2 sprays into both nostrils daily. 16 g 6  . glipiZIDE (GLUCOTROL) 5 MG tablet Take 1 tablet (5 mg total) by mouth 2 (two) times daily before a meal. 180 tablet 0  . glucose blood (TRUE METRIX BLOOD GLUCOSE TEST) test strip Use as instructed. Please fill as a 90 day supply 200 each 12  . lisinopril (ZESTRIL) 2.5 MG tablet Take 1 tablet (2.5 mg total) by mouth daily. 90 tablet 2  . metFORMIN (GLUCOPHAGE) 500 MG tablet Take 2 tablets (1,000 mg total) by mouth 2 (two) times daily with a meal. 360 tablet 1  . mupirocin ointment (BACTROBAN) 2 % Apply 1 application topically 2 (two) times daily. 30 g 0  . olopatadine (PATANOL) 0.1 % ophthalmic solution Place 1 drop into the left eye 2 (two) times daily. 5 mL 12  . pravastatin (PRAVACHOL) 40 MG tablet Take 1 tablet (40 mg total) by mouth daily. 90 tablet 1  . TRUEplus Lancets 28G  MISC Use as prescribed. Please fill as a 90 day supply 200 each 6   No facility-administered medications prior to visit.     No Known Allergies     Objective:    BP 113/75 (BP Location: Left Arm, Patient Position: Sitting, Cuff Size: Normal)   Pulse 75   Temp 98.2 F (36.8 C) (Oral)   Ht _0  (1.6 m)   Wt 132 lb (59.9 kg)   SpO2 97%   BMI 23.38 kg/m  Wt Readings from Last 3 Encounters:  05/20/19 132 lb (59.9 kg)  04/15/19 133 lb (60.3 kg)  10/11/18 133 lb (60.3 kg)    Physical Exam Constitutional:      Appearance: She is well-developed.  HENT:     Head: Normocephalic.  Cardiovascular:     Rate and  Rhythm: Normal rate and regular rhythm.     Heart sounds: Normal heart sounds.  Pulmonary:     Effort: Pulmonary effort is normal.     Breath sounds: Normal breath sounds.  Abdominal:     General: Bowel sounds are normal.     Palpations: Abdomen is soft.     Hernia: There is no hernia in the left inguinal area.  Genitourinary:    Labia:        Right: No rash, tenderness, lesion or injury.        Left: No rash, tenderness, lesion or injury.      Vagina: Normal. No signs of injury and foreign body. No vaginal discharge, erythema, tenderness or bleeding.     Cervix: No cervical motion tenderness or friability.     Uterus: Not deviated and not enlarged.      Adnexa:        Right: No mass, tenderness or fullness.         Left: No mass, tenderness or fullness.       Rectum: Normal. No external hemorrhoid.  Lymphadenopathy:     Lower Body: No right inguinal adenopathy. No left inguinal adenopathy.  Skin:    General: Skin is warm and dry.  Neurological:     Mental Status: She is alert and oriented to person, place, and time.  Psychiatric:        Behavior: Behavior normal.        Thought Content: Thought content normal.        Judgment: Judgment normal.        Patient has been counseled extensively about nutrition and exercise as well as the importance of adherence with medications and regular follow-up. The patient was given clear instructions to go to ER or return to medical center if symptoms don't improve, worsen or new problems develop. The patient verbalized understanding.   Follow-up: Return in 2 months (on 07/20/2019) for DM.   Gildardo Pounds, FNP-BC [facility] [place], [place]   05/20/2019, 4:31 PM

## 2019-05-25 LAB — CERVICOVAGINAL ANCILLARY ONLY
Bacterial Vaginitis (gardnerella): NEGATIVE
Candida Glabrata: NEGATIVE
Candida Vaginitis: NEGATIVE
Comment: NEGATIVE
Comment: NEGATIVE
Comment: NEGATIVE
Comment: NEGATIVE
Trichomonas: NEGATIVE

## 2019-05-31 ENCOUNTER — Ambulatory Visit: Payer: Self-pay | Attending: Family Medicine

## 2019-05-31 ENCOUNTER — Ambulatory Visit: Payer: Self-pay

## 2019-05-31 ENCOUNTER — Other Ambulatory Visit: Payer: Self-pay

## 2019-06-07 ENCOUNTER — Other Ambulatory Visit: Payer: Self-pay

## 2019-06-07 ENCOUNTER — Ambulatory Visit
Admission: RE | Admit: 2019-06-07 | Discharge: 2019-06-07 | Disposition: A | Discharge status: Home / Self Care | Payer: No Typology Code available for payment source | Source: Ambulatory Visit | Attending: Nurse Practitioner | Admitting: Nurse Practitioner

## 2019-06-07 DIAGNOSIS — Z1231 Encounter for screening mammogram for malignant neoplasm of breast: Secondary | ICD-10-CM

## 2019-07-22 ENCOUNTER — Ambulatory Visit: Payer: Self-pay | Attending: Nurse Practitioner | Admitting: Nurse Practitioner

## 2019-07-22 ENCOUNTER — Other Ambulatory Visit: Payer: Self-pay

## 2019-07-22 ENCOUNTER — Encounter: Payer: Self-pay | Admitting: Nurse Practitioner

## 2019-07-22 DIAGNOSIS — E785 Hyperlipidemia, unspecified: Secondary | ICD-10-CM

## 2019-07-22 DIAGNOSIS — E119 Type 2 diabetes mellitus without complications: Secondary | ICD-10-CM

## 2019-07-22 NOTE — Progress Notes (Signed)
Virtual Visit via Telephone Note Due to national recommendations of social distancing due to [place] 19, telehealth visit is felt to be most appropriate for this patient at this time.  I discussed the limitations, risks, security and privacy concerns of performing an evaluation and management service by telephone and the availability of in person appointments. I also discussed with the patient that there may be a patient responsible charge related to this service. The patient expressed understanding and agreed to proceed.    I connected with Heidi Shaw on 07/22/19  at   4:10 PM EST  EDT by telephone and verified that I am speaking with the correct person using two identifiers.   Consent I discussed the limitations, risks, security and privacy concerns of performing an evaluation and management service by telephone and the availability of in person appointments. I also discussed with the patient that there may be a patient responsible charge related to this service. The patient expressed understanding and agreed to proceed.   Location of Patient: Private Residence   Location of Provider: [place] and [place] participating in Telemedicine visit: Geryl Rankins FNP-BC [facility] CMA Fauna Neuner  [place] Interpreter ID# [id number]   History of Present Illness: Telemedicine visit for: DM  DM TYPE 2 Controlled. Monitoring her blood glucose levels twice per day.  Fasting Averages: 120-150. Postprandial averages: 110s. Denies any hypoglycemia or hyperglycemia symptoms. Taking glipizide 5mg  BID and metformin 1000 mg BID. She is on low dose ACE and taking Pravastatin 40 mg daily.   Lab Results  Component Value Date   HGBA1C 7.1 (A) 04/15/2019   Dyslipidemia LDL not at goal of <70. She denies any statin intolerance or myalgias. Diet is not well controlled in regard to low fat.  Lab Results  Component Value Date   [place] 99 04/15/2019     Past  Medical History:  Diagnosis Date  . Anemia   . Diabetes mellitus without complication ([place])   . Heart murmur   . Hyperlipemia     Past Surgical History:  Procedure Laterality Date  . CESAREAN SECTION      Family History  Problem Relation Age of Onset  . Diabetes Maternal Uncle   . Diabetes Maternal Uncle     Social History   Socioeconomic History  . Marital status: Single    Spouse name: Not on file  . Number of children: 4  . Years of education: Not on file  . Highest education level: Not on file  Occupational History  . Occupation: Factory - Pensions consultant   Tobacco Use  . Smoking status: Never Smoker  . Smokeless tobacco: Never Used  Substance and Sexual Activity  . Alcohol use: No  . Drug use: No  . Sexual activity: Yes    Birth control/protection: Implant  Other Topics Concern  . Not on file  Social History Narrative  . Not on file   Social Determinants of Health   Financial Resource Strain:   . Difficulty of Paying Living Expenses: Not on file  Food Insecurity:   . Worried About Charity fundraiser in the Last Year: Not on file  . Ran Out of Food in the Last Year: Not on file  Transportation Needs:   . Lack of Transportation (Medical): Not on file  . Lack of Transportation (Non-Medical): Not on file  Physical Activity:   . Days of Exercise per Week: Not on file  . Minutes of Exercise per Session: Not on  file  Stress:   . Feeling of Stress : Not on file  Social Connections:   . Frequency of Communication with Friends and Family: Not on file  . Frequency of Social Gatherings with Friends and Family: Not on file  . Attends Religious Services: Not on file  . Active Member of Clubs or Organizations: Not on file  . Attends Banker Meetings: Not on file  . Marital Status: Not on file     Observations/Objective: Awake, alert and oriented x 3   Review of Systems  Constitutional: Negative for fever, malaise/fatigue and weight loss.  HENT:  Negative.  Negative for nosebleeds.   Eyes: Negative.  Negative for blurred vision, double vision and photophobia.  Respiratory: Negative.  Negative for cough and shortness of breath.   Cardiovascular: Negative.  Negative for chest pain, palpitations and leg swelling.  Gastrointestinal: Negative.  Negative for heartburn, nausea and vomiting.  Musculoskeletal: Negative.  Negative for myalgias.  Neurological: Negative.  Negative for dizziness, focal weakness, seizures and headaches.  Psychiatric/Behavioral: Negative.  Negative for suicidal ideas.    Assessment and Plan: Heidi Shaw was seen today for follow-up.  Diagnoses and all orders for this visit:  Type 2 diabetes mellitus without complication, without long-term current use of insulin (HCC) -     metFORMIN (GLUCOPHAGE) 500 MG tablet; Take 2 tablets (1,000 mg total) by mouth 2 (two) times daily with a meal. -     glipiZIDE (GLUCOTROL) 5 MG tablet; Take 1 tablet (5 mg total) by mouth 2 (two) times daily before a meal. Continue blood sugar control as discussed in office today, low carbohydrate diet, and regular physical exercise as tolerated, 150 minutes per week (30 min each day, 5 days per week, or 50 min 3 days per week). Keep blood sugar logs with fasting goal of 90-130 mg/dl, post prandial (after you eat) less than 180.  For Hypoglycemia: BS <60 and Hyperglycemia BS >400; contact the clinic ASAP. Annual eye exams and foot exams are recommended.  Dyslipidemia, goal LDL below 70 -     pravastatin (PRAVACHOL) 40 MG tablet; Take 1 tablet (40 mg total) by mouth daily. INSTRUCTIONS: Work on a low fat, heart healthy diet and participate in regular aerobic exercise program by working out at least 150 minutes per week; 5 days a week-30 minutes per day. Avoid red meat/beef/steak,  fried foods. junk foods, sodas, sugary drinks, unhealthy snacking, alcohol and smoking.  Drink at least 80 oz of water per day and monitor your carbohydrate intake daily.     Follow Up Instructions Return in about 3 months (around 10/20/2019).     I discussed the assessment and treatment plan with the patient. The patient was provided an opportunity to ask questions and all were answered. The patient agreed with the plan and demonstrated an understanding of the instructions.   The patient was advised to call back or seek an in-person evaluation if the symptoms worsen or if the condition fails to improve as anticipated.  I provided 15 minutes of non-face-to-face time during this encounter including median intraservice time, reviewing previous notes, labs, imaging, medications and explaining diagnosis and management.  Claiborne Rigg, FNP-BC

## 2019-07-25 MED FILL — TRUE METRIX TEST STRIP: 90 days supply | Qty: 300 | Fill #1

## 2019-07-25 MED FILL — LISINOPRIL 2.5 MG TABLET: 2.5 | 90 days supply | Qty: 90 | Fill #1

## 2019-07-25 MED FILL — metFORMIN HCL 500 MG TABS: 500 | 90 days supply | Qty: 360 | Fill #1

## 2019-07-25 MED FILL — PRAVASTATIN NA 40 MG TAB: 40 | 90 days supply | Qty: 90 | Fill #1

## 2019-07-25 MED FILL — glipiZIDE 5 MG TABS: 5 | 90 days supply | Qty: 180 | Fill #1

## 2019-08-07 ENCOUNTER — Encounter: Payer: Self-pay | Admitting: Nurse Practitioner

## 2019-08-07 MED ORDER — GLIPIZIDE 5 MG PO TABS: 5.0000 mg | ORAL_TABLET | Freq: Two times a day (BID) | ORAL | 0 refills | Status: DC

## 2019-08-07 MED ORDER — PRAVASTATIN SODIUM 40 MG PO TABS: 40.0000 mg | ORAL_TABLET | Freq: Every day | ORAL | 1 refills | Status: DC

## 2019-08-07 MED ORDER — METFORMIN HCL 500 MG PO TABS: 1000.0000 mg | ORAL_TABLET | Freq: Two times a day (BID) | ORAL | 1 refills | Status: DC

## 2019-08-08 MED FILL — metFORMIN HCL 500 MG TABS: 500 | 90 days supply | Qty: 360 | Fill #0

## 2019-08-15 ENCOUNTER — Ambulatory Visit: Payer: Self-pay | Attending: Nurse Practitioner

## 2019-08-15 ENCOUNTER — Other Ambulatory Visit: Payer: Self-pay

## 2019-08-15 DIAGNOSIS — E1169 Type 2 diabetes mellitus with other specified complication: Secondary | ICD-10-CM

## 2019-08-15 DIAGNOSIS — E785 Hyperlipidemia, unspecified: Secondary | ICD-10-CM

## 2019-08-15 DIAGNOSIS — E119 Type 2 diabetes mellitus without complications: Secondary | ICD-10-CM

## 2019-08-16 LAB — LIPID PANEL
Chol/HDL Ratio: 3.8 ratio (ref 0.0–4.4)
Cholesterol, Total: 192 mg/dL (ref 100–199)
HDL: 50 mg/dL (ref >39)
LDL Chol Calc (NIH): 116 mg/dL — ABNORMAL HIGH (ref 0–99)
Triglycerides: 148 mg/dL (ref 0–149)
VLDL Cholesterol Cal: 26 mg/dL (ref 5–40)

## 2019-08-16 LAB — CMP14+EGFR
ALT: 20 IU/L (ref 0–32)
AST: 14 IU/L (ref 0–40)
Albumin/Globulin Ratio: 1.8 (ref 1.2–2.2)
Albumin: 4.6 g/dL (ref 3.8–4.8)
Alkaline Phosphatase: 103 IU/L (ref 39–117)
BUN/Creatinine Ratio: 17 (ref 9–23)
BUN: 11 mg/dL (ref 6–24)
Bilirubin Total: 0.4 mg/dL (ref 0.0–1.2)
CO2: 22 mmol/L (ref 20–29)
Calcium: 9.7 mg/dL (ref 8.7–10.2)
Chloride: 100 mmol/L (ref 96–106)
Creatinine, Ser: 0.66 mg/dL (ref 0.57–1.00)
GFR calc Af Amer: 120 mL/min/{1.73_m2} (ref >59)
GFR calc non Af Amer: 104 mL/min/{1.73_m2} (ref >59)
Globulin, Total: 2.6 g/dL (ref 1.5–4.5)
Glucose: 141 mg/dL — ABNORMAL HIGH (ref 65–99)
Potassium: 4.6 mmol/L (ref 3.5–5.2)
Sodium: 138 mmol/L (ref 134–144)
Total Protein: 7.2 g/dL (ref 6.0–8.5)

## 2019-08-16 LAB — HEMOGLOBIN A1C
Est. average glucose Bld gHb Est-mCnc: 160 mg/dL
Hgb A1c MFr Bld: 7.2 % — ABNORMAL HIGH (ref 4.8–5.6)

## 2019-09-01 ENCOUNTER — Telehealth: Payer: Self-pay

## 2019-09-01 NOTE — Telephone Encounter (Signed)
Patient called back and was informed on lab results with PCP advising.   Spanish interpreter assist with the call.

## 2019-10-11 ENCOUNTER — Ambulatory Visit: Payer: Self-pay | Attending: Nurse Practitioner | Admitting: Nurse Practitioner

## 2019-10-11 ENCOUNTER — Encounter: Payer: Self-pay | Admitting: Nurse Practitioner

## 2019-10-11 ENCOUNTER — Other Ambulatory Visit: Payer: Self-pay

## 2019-10-11 DIAGNOSIS — E119 Type 2 diabetes mellitus without complications: Secondary | ICD-10-CM

## 2019-10-11 DIAGNOSIS — E785 Hyperlipidemia, unspecified: Secondary | ICD-10-CM

## 2019-10-11 MED ORDER — TRUE METRIX METER W/DEVICE KIT: PACK | 0 refills | Status: DC

## 2019-10-11 MED ORDER — TRUE METRIX BLOOD GLUCOSE TEST VI STRP: ORAL_STRIP | 12 refills | Status: DC

## 2019-10-11 MED ORDER — METFORMIN HCL 500 MG PO TABS: 1000.0000 mg | ORAL_TABLET | Freq: Two times a day (BID) | ORAL | 1 refills | Status: DC

## 2019-10-11 MED ORDER — LISINOPRIL 2.5 MG PO TABS: 2.5000 mg | ORAL_TABLET | Freq: Every day | ORAL | 2 refills | Status: DC

## 2019-10-11 NOTE — Progress Notes (Signed)
Virtual Visit via Telephone Note Due to national recommendations of social distancing due to [place] 19, telehealth visit is felt to be most appropriate for this patient at this time.  I discussed the limitations, risks, security and privacy concerns of performing an evaluation and management service by telephone and the availability of in person appointments. I also discussed with the patient that there may be a patient responsible charge related to this service. The patient expressed understanding and agreed to proceed.    I connected with Heidi Shaw on 10/11/19  at   4:10 PM EST  EDT by telephone and verified that I am speaking with the correct person using two identifiers.   Consent I discussed the limitations, risks, security and privacy concerns of performing an evaluation and management service by telephone and the availability of in person appointments. I also discussed with the patient that there may be a patient responsible charge related to this service. The patient expressed understanding and agreed to proceed.   Location of Patient: Private Residence   Location of Provider: [place] and Heidi Shaw participating in Telemedicine visit: Heidi Rankins FNP-BC [facility] CMA Heidi Shaw    History of Present Illness: Telemedicine visit for: Follow up   DM TYPE 2 Requesting new meter. Does not feel like readings are accurate. Average readings Fasting: 140-180. Taking Glipizide 5 mg BID and metformin 1000 mg BID. On renal dose ACE and STATIN.  Lab Results  Component Value Date   HGBA1C 7.2 (H) 08/15/2019   Dyslipidemia LDL not at goal. Likely related to dietary non adherence. Taking pravastatin 40 mg daily. Denies any statin intolerance.  Lab Results  Component Value Date   LDLCALC 116 (H) 08/15/2019    Past Medical History:  Diagnosis Date  . Anemia   . Diabetes mellitus without complication ([place])   . Heart murmur   .  Hyperlipemia     Past Surgical History:  Procedure Laterality Date  . CESAREAN SECTION      Family History  Problem Relation Age of Onset  . Diabetes Maternal Uncle   . Diabetes Maternal Uncle     Social History   Socioeconomic History  . Marital status: Single    Spouse name: Not on file  . Number of children: 4  . Years of education: Not on file  . Highest education level: Not on file  Occupational History  . Occupation: Factory - Pensions consultant   Tobacco Use  . Smoking status: Never Smoker  . Smokeless tobacco: Never Used  Substance and Sexual Activity  . Alcohol use: No  . Drug use: No  . Sexual activity: Yes    Birth control/protection: Implant  Other Topics Concern  . Not on file  Social History Narrative  . Not on file   Social Determinants of Health   Financial Resource Strain:   . Difficulty of Paying Living Expenses:   Food Insecurity:   . Worried About Charity fundraiser in the Last Year:   . Arboriculturist in the Last Year:   Transportation Needs:   . Film/video editor (Medical):   Marland Kitchen Lack of Transportation (Non-Medical):   Physical Activity:   . Days of Exercise per Week:   . Minutes of Exercise per Session:   Stress:   . Feeling of Stress :   Social Connections:   . Frequency of Communication with Friends and Family:   . Frequency of Social Gatherings with Friends and  Family:   . Attends Religious Services:   . Active Member of Clubs or Organizations:   . Attends Archivist Meetings:   Marland Kitchen Marital Status:      Observations/Objective: Awake, alert and oriented x 3   Review of Systems  Constitutional: Negative for fever, malaise/fatigue and weight loss.  HENT: Negative.  Negative for nosebleeds.   Eyes: Negative.  Negative for blurred vision, double vision and photophobia.  Respiratory: Negative.  Negative for cough and shortness of breath.   Cardiovascular: Negative.  Negative for chest pain, palpitations and leg swelling.   Gastrointestinal: Negative.  Negative for heartburn, nausea and vomiting.  Musculoskeletal: Negative.  Negative for myalgias.  Neurological: Negative.  Negative for dizziness, focal weakness, seizures and headaches.  Psychiatric/Behavioral: Negative.  Negative for suicidal ideas.    Assessment and Plan: [name] was seen today for follow-up.  Diagnoses and all orders for this visit:  Type 2 diabetes mellitus without complication, without long-term current use of insulin (HCC) -     Blood Glucose Monitoring Suppl (TRUE METRIX METER) w/Device KIT; Use to check blood sugars once a day. -     glucose blood (TRUE METRIX BLOOD GLUCOSE TEST) test strip; Use as instructed. Please fill as a 90 day supply -     metFORMIN (GLUCOPHAGE) 500 MG tablet; Take 2 tablets (1,000 mg total) by mouth 2 (two) times daily with a meal. -     lisinopril (ZESTRIL) 2.5 MG tablet; Take 1 tablet (2.5 mg total) by mouth daily. Continue blood sugar control as discussed in office today, low carbohydrate diet, and regular physical exercise as tolerated, 150 minutes per week (30 min each day, 5 days per week, or 50 min 3 days per week). Keep blood sugar logs with fasting goal of 90-130 mg/dl, post prandial (after you eat) less than 180.  For Hypoglycemia: BS <60 and Hyperglycemia BS >400; contact the clinic ASAP. Annual eye exams and foot exams are recommended.  Dyslipidemia, goal LDL below 70 INSTRUCTIONS: Work on a low fat, heart healthy diet and participate in regular aerobic exercise program by working out at least 150 minutes per week; 5 days a week-30 minutes per day. Avoid red meat/beef/steak,  fried foods. junk foods, sodas, sugary drinks, unhealthy snacking, alcohol and smoking.  Drink at least 80 oz of water per day and monitor your carbohydrate intake daily.      Follow Up Instructions Return in about 6 weeks (around 11/22/2019).     I discussed the assessment and treatment plan with the patient. The patient was  provided an opportunity to ask questions and all were answered. The patient agreed with the plan and demonstrated an understanding of the instructions.   The patient was advised to call back or seek an in-person evaluation if the symptoms worsen or if the condition fails to improve as anticipated.  I provided 16 minutes of non-face-to-face time during this encounter including median intraservice time, reviewing previous notes, labs, imaging, medications and explaining diagnosis and management.  Gildardo Pounds, FNP-BC

## 2019-10-12 MED FILL — PRAVASTATIN NA 40 MG TAB: 40 | 90 days supply | Qty: 90 | Fill #0

## 2019-10-12 MED FILL — !TRUE METRIX BLOOD GLUCOSE: 1 days supply | Qty: 1 | Fill #0

## 2019-10-12 MED FILL — LISINOPRIL 2.5 MG TABLET: 2.5 | 90 days supply | Qty: 90 | Fill #0

## 2019-10-12 MED FILL — metFORMIN HCL 500 MG TABS: 500 | 90 days supply | Qty: 360 | Fill #0

## 2019-10-12 MED FILL — TRUE METRIX TEST STRIP: 25 days supply | Qty: 100 | Fill #0

## 2019-10-12 MED FILL — glipiZIDE 5 MG TABS: 5 | 90 days supply | Qty: 180 | Fill #0

## 2019-10-29 ENCOUNTER — Encounter: Payer: Self-pay | Admitting: Nurse Practitioner

## 2019-11-15 ENCOUNTER — Other Ambulatory Visit: Payer: Self-pay

## 2019-11-15 ENCOUNTER — Encounter: Payer: Self-pay | Admitting: Nurse Practitioner

## 2019-11-15 ENCOUNTER — Ambulatory Visit: Payer: Self-pay | Attending: Nurse Practitioner | Admitting: Nurse Practitioner

## 2019-11-15 VITALS — BP 109/71 | HR 74 | Temp 97.9°F | Ht 63.0 in | Wt 135.0 lb

## 2019-11-15 DIAGNOSIS — B351 Tinea unguium: Secondary | ICD-10-CM

## 2019-11-15 DIAGNOSIS — E785 Hyperlipidemia, unspecified: Secondary | ICD-10-CM

## 2019-11-15 DIAGNOSIS — M2550 Pain in unspecified joint: Secondary | ICD-10-CM

## 2019-11-15 DIAGNOSIS — E119 Type 2 diabetes mellitus without complications: Secondary | ICD-10-CM

## 2019-11-15 LAB — POCT GLYCOSYLATED HEMOGLOBIN (HGB A1C): Hemoglobin A1C: 7.1 % — AB (ref 4.0–5.6)

## 2019-11-15 LAB — GLUCOSE, POCT (MANUAL RESULT ENTRY): POC Glucose: 68 mg/dl — AB (ref 70–99)

## 2019-11-15 MED ORDER — TERBINAFINE HCL 250 MG PO TABS: 250.0000 mg | ORAL_TABLET | Freq: Every day | ORAL | 0 refills | Status: AC

## 2019-11-15 MED ORDER — GLIPIZIDE 5 MG PO TABS: 5.0000 mg | ORAL_TABLET | Freq: Two times a day (BID) | ORAL | 0 refills | Status: DC

## 2019-11-15 MED ORDER — PRAVASTATIN SODIUM 40 MG PO TABS: 40.0000 mg | ORAL_TABLET | Freq: Every day | ORAL | 1 refills | Status: DC

## 2019-11-15 MED ORDER — DICLOFENAC SODIUM 1 % EX GEL: 2.0000 g | Freq: Four times a day (QID) | CUTANEOUS | 1 refills | Status: AC

## 2019-11-15 NOTE — Progress Notes (Signed)
Assessment & Plan:  Heidi Shaw was seen today for follow-up.  Diagnoses and all orders for this visit:  Type 2 diabetes mellitus without complication, without long-term current use of insulin (HCC) -     Glucose (CBG) -     HgB A1c -     glipiZIDE (GLUCOTROL) 5 MG tablet; Take 1 tablet (5 mg total) by mouth 2 (two) times daily before a meal. Continue blood sugar control as discussed in office today, low carbohydrate diet, and regular physical exercise as tolerated, 150 minutes per week (30 min each day, 5 days per week, or 50 min 3 days per week). Keep blood sugar logs with fasting goal of 90-130 mg/dl, post prandial (after you eat) less than 180.  For Hypoglycemia: BS <60 and Hyperglycemia BS >400; contact the clinic ASAP. Annual eye exams and foot exams are recommended.   Dyslipidemia, goal LDL below 70 -     pravastatin (PRAVACHOL) 40 MG tablet; Take 1 tablet (40 mg total) by mouth daily. INSTRUCTIONS: Work on a low fat, heart healthy diet and participate in regular aerobic exercise program by working out at least 150 minutes per week; 5 days a week-30 minutes per day. Avoid red meat/beef/steak,  fried foods. junk foods, sodas, sugary drinks, unhealthy snacking, alcohol and smoking.  Drink at least 80 oz of water per day and monitor your carbohydrate intake daily.    Onychomycosis -     terbinafine (LAMISIL) 250 MG tablet; Take 1 tablet (250 mg total) by mouth daily. -     Hepatic Function Panel  Arthralgia of multiple joints -     diclofenac Sodium (VOLTAREN) 1 % GEL; Apply 2 g topically 4 (four) times daily. Work on losing weight to help reduce joint pain. May alternate with heat and ice application for pain relief. May also alternate with acetaminophen  as prescribed pain relief. Other alternatives include massage, acupuncture and water aerobics.  You must stay active and avoid a sedentary lifestyle.    Patient has been counseled on age-appropriate routine health concerns for  screening and prevention. These are reviewed and up-to-date. Referrals have been placed accordingly. Immunizations are up-to-date or declined.    Subjective:   Chief Complaint  Patient presents with  . Follow-up    Pt. is here for diabetes follow up.    HPI Heidi Shaw 49 y.o. female presents to office today for DM follow up. VRI was used to communicate directly with patient for the entire encounter including providing detailed patient instructions.    DM TYPE 2 Well controlled. She has her diabetes log with her today  Fasting average: 130-140s; Post prandial 160-170s. Denies any symptoms of hyperglycemia.  She is overdue for eye xam. Referral placed today.  Endorses hypoglycemic symptoms with some readings in the 60s. She does endorse missing meals. I have instructed her to not administer diabetes medications if she is not eating a full meal.   Lab Results  Component Value Date   HGBA1C 7.1 (A) 11/15/2019     Dyslipidemia LDL is not at goal of <70. Taking pravastatin 40 mg daily. Denies any statin intolerance.  Lab Results  Component Value Date   LDLCALC 116 (H) 08/15/2019    Review of Systems  Constitutional: Negative for fever, malaise/fatigue and weight loss.  HENT: Negative.  Negative for nosebleeds.   Eyes: Negative.  Negative for blurred vision, double vision and photophobia.  Respiratory: Negative.  Negative for cough and shortness of breath.   Cardiovascular: Negative.  Negative for chest pain, palpitations and leg swelling.  Gastrointestinal: Negative.  Negative for heartburn, nausea and vomiting.  Musculoskeletal: Positive for joint pain (left shoulder ). Negative for myalgias.  Neurological: Negative.  Negative for dizziness, focal weakness, seizures and headaches.  Psychiatric/Behavioral: Negative.  Negative for suicidal ideas.    Past Medical History:  Diagnosis Date  . Anemia   . Diabetes mellitus without complication ([place])   . Heart murmur   .  Hyperlipemia     Past Surgical History:  Procedure Laterality Date  . CESAREAN SECTION      Family History  Problem Relation Age of Onset  . Diabetes Maternal Uncle   . Diabetes Maternal Uncle     Social History Reviewed with no changes to be made today.   Outpatient Medications Prior to Visit  Medication Sig Dispense Refill  . aspirin 81 MG tablet Take 1 tablet (81 mg total) by mouth daily. Reported on 08/09/2015 90 tablet 4  . Blood Glucose Monitoring Suppl (TRUE METRIX METER) w/Device KIT Use to check blood sugars once a day. 1 kit 0  . fluticasone (FLONASE) 50 MCG/ACT nasal spray Place 2 sprays into both nostrils daily. 16 g 6  . glucose blood (TRUE METRIX BLOOD GLUCOSE TEST) test strip Use as instructed. Please fill as a 90 day supply 200 each 12  . lisinopril (ZESTRIL) 2.5 MG tablet Take 1 tablet (2.5 mg total) by mouth daily. 90 tablet 2  . metFORMIN (GLUCOPHAGE) 500 MG tablet Take 2 tablets (1,000 mg total) by mouth 2 (two) times daily with a meal. 360 tablet 1  . mupirocin ointment (BACTROBAN) 2 % Apply 1 application topically 2 (two) times daily. 30 g 0  . olopatadine (PATANOL) 0.1 % ophthalmic solution Place 1 drop into the left eye 2 (two) times daily. 5 mL 12  . TRUEplus Lancets 28G MISC Use as prescribed. Please fill as a 90 day supply 200 each 6  . glipiZIDE (GLUCOTROL) 5 MG tablet Take 1 tablet (5 mg total) by mouth 2 (two) times daily before a meal. 180 tablet 0  . pravastatin (PRAVACHOL) 40 MG tablet Take 1 tablet (40 mg total) by mouth daily. 90 tablet 1   No facility-administered medications prior to visit.    No Known Allergies     Objective:    BP 109/71 (BP Location: Left Arm, Patient Position: Sitting, Cuff Size: Normal)   Pulse 74   Temp 97.9 F (36.6 C) (Temporal)   Ht 5' 3" (1.6 m)   Wt 135 lb (61.2 kg)   SpO2 98%   BMI 23.91 kg/m  Wt Readings from Last 3 Encounters:  11/15/19 135 lb (61.2 kg)  05/20/19 132 lb (59.9 kg)  04/15/19 133 lb (60.3  kg)    Physical Exam Vitals and nursing note reviewed.  Constitutional:      Appearance: She is well-developed.  HENT:     Head: Normocephalic and atraumatic.  Cardiovascular:     Rate and Rhythm: Normal rate and regular rhythm.     Pulses:          Dorsalis pedis pulses are 2+ on the right side and 2+ on the left side.       Posterior tibial pulses are 2+ on the right side and 2+ on the left side.     Heart sounds: Normal heart sounds. No murmur. No friction rub. No gallop.   Pulmonary:     Effort: Pulmonary effort is normal. No tachypnea or respiratory distress.  Breath sounds: Normal breath sounds. No decreased breath sounds, wheezing, rhonchi or rales.  Chest:     Chest wall: No tenderness.  Abdominal:     General: Bowel sounds are normal.     Palpations: Abdomen is soft.  Musculoskeletal:        General: Normal range of motion.     Cervical back: Normal range of motion.  Feet:     Right foot:     Toenail Condition: Fungal disease present.    Left foot:     Toenail Condition: Fungal disease present. Skin:    General: Skin is warm and dry.  Neurological:     Mental Status: She is alert and oriented to person, place, and time.     Coordination: Coordination normal.  Psychiatric:        Behavior: Behavior normal. Behavior is cooperative.        Thought Content: Thought content normal.        Judgment: Judgment normal.          Patient has been counseled extensively about nutrition and exercise as well as the importance of adherence with medications and regular follow-up. The patient was given clear instructions to go to ER or return to medical center if symptoms don't improve, worsen or new problems develop. The patient verbalized understanding.   Follow-up: Return in about 6 weeks (around 12/27/2019) for labs only .   Gildardo Pounds, FNP-BC [facility] [place], [place]   11/16/2019, 8:50 PM

## 2019-11-16 ENCOUNTER — Encounter: Payer: Self-pay | Admitting: Nurse Practitioner

## 2019-11-16 MED FILL — TERBINAFINE HCL 250 MG TAB: 250 | 90 days supply | Qty: 90 | Fill #0

## 2019-11-16 MED FILL — DICLOFENAC SODIUM 1% GEL: 1 | 12 days supply | Qty: 100 | Fill #0

## 2019-12-16 ENCOUNTER — Ambulatory Visit: Payer: No Typology Code available for payment source

## 2019-12-16 ENCOUNTER — Other Ambulatory Visit: Payer: Self-pay

## 2019-12-30 ENCOUNTER — Ambulatory Visit: Payer: Self-pay | Attending: Nurse Practitioner | Admitting: Nurse Practitioner

## 2019-12-30 ENCOUNTER — Other Ambulatory Visit: Payer: Self-pay

## 2019-12-30 MED FILL — glipiZIDE 5 MG TABS: 5 | 90 days supply | Qty: 180 | Fill #0

## 2019-12-31 LAB — HEPATIC FUNCTION PANEL
ALT: 27 IU/L (ref 0–32)
AST: 20 IU/L (ref 0–40)
Albumin: 4.7 g/dL (ref 3.8–4.8)
Alkaline Phosphatase: 93 IU/L (ref 48–121)
Bilirubin Total: <0.2 mg/dL (ref 0.0–1.2)
Bilirubin, Direct: 0.08 mg/dL (ref 0.00–0.40)
Total Protein: 7.3 g/dL (ref 6.0–8.5)

## 2020-01-03 ENCOUNTER — Other Ambulatory Visit: Payer: Self-pay | Admitting: Nurse Practitioner

## 2020-02-14 ENCOUNTER — Ambulatory Visit: Payer: Self-pay | Attending: Nurse Practitioner | Admitting: Nurse Practitioner

## 2020-02-14 ENCOUNTER — Other Ambulatory Visit: Payer: Self-pay | Admitting: Nurse Practitioner

## 2020-02-14 ENCOUNTER — Other Ambulatory Visit: Payer: Self-pay

## 2020-02-14 ENCOUNTER — Encounter: Payer: Self-pay | Admitting: Nurse Practitioner

## 2020-02-14 DIAGNOSIS — E119 Type 2 diabetes mellitus without complications: Secondary | ICD-10-CM

## 2020-02-14 DIAGNOSIS — E785 Hyperlipidemia, unspecified: Secondary | ICD-10-CM

## 2020-02-14 MED ORDER — TRUEPLUS LANCETS 28G MISC: 6 refills | Status: DC

## 2020-02-14 MED ORDER — TRUE METRIX BLOOD GLUCOSE TEST VI STRP: ORAL_STRIP | 12 refills | Status: DC

## 2020-02-14 MED ORDER — PRAVASTATIN SODIUM 40 MG PO TABS: 40.0000 mg | ORAL_TABLET | Freq: Every day | ORAL | 1 refills | Status: DC

## 2020-02-14 MED ORDER — GLIPIZIDE 5 MG PO TABS: 5.0000 mg | ORAL_TABLET | Freq: Two times a day (BID) | ORAL | 0 refills | Status: DC

## 2020-02-14 MED ORDER — METFORMIN HCL 500 MG PO TABS: 1000.0000 mg | ORAL_TABLET | Freq: Two times a day (BID) | ORAL | 1 refills | Status: DC

## 2020-02-14 MED ORDER — LISINOPRIL 2.5 MG PO TABS: 2.5000 mg | ORAL_TABLET | Freq: Every day | ORAL | 2 refills | Status: DC

## 2020-02-14 NOTE — Progress Notes (Signed)
Virtual Visit via Telephone Note Due to national recommendations of social distancing due to COVID 19, telehealth visit is felt to be most appropriate for this patient at this time.  I discussed the limitations, risks, security and privacy concerns of performing an evaluation and management service by telephone and the availability of in person appointments. I also discussed with the patient that there may be a patient responsible charge related to this service. The patient expressed understanding and agreed to proceed.    I connected with Heidi Shaw on 02/14/20  at   4:10 PM EDT  EDT by telephone and verified that I am speaking with the correct person using two identifiers.   Consent I discussed the limitations, risks, security and privacy concerns of performing an evaluation and management service by telephone and the availability of in person appointments. I also discussed with the patient that there may be a patient responsible charge related to this service. The patient expressed understanding and agreed to proceed.   Location of Patient: Network engineer of Provider: [facility]    Persons participating in Telemedicine visit: Bertram Denver FNP-BC YY [place] CMA Shakirra Buehler  Spanish Interpreter EL#[id number]   History of Present Illness: Telemedicine visit for: Follow up.  has a past medical history of Anemia, Diabetes mellitus without complication (HCC), Heart murmur, and Hyperlipemia.  Patient has been counseled on age-appropriate routine health concerns for screening and prevention. These are reviewed and up-to-date. Referrals have been placed accordingly. Immunizations are up-to-date or declined.    PAP SMEAR: States Pap smear will be performed at the health department in August MAMMOGRAM: Due November 2021  DM TYPE 2 Monitoring her blood glucose levels twice per day. Yesterday AM Fasting reading 135, Average postprandial  170-180s.  Endorses medication adherence taking Metformin 1000 mg twice daily and glipizide 5 mg twice daily.  LDL is not at goal however she does endorse medication adherence taking pravastatin 40 mg daily.  Denies any statin intolerance or myalgia.  Lab Results  Component Value Date   HGBA1C 7.1 (A) 11/15/2019   Lab Results  Component Value Date   LDLCALC 116 (H) 08/15/2019     Past Medical History:  Diagnosis Date  . Anemia   . Diabetes mellitus without complication (HCC)   . Heart murmur   . Hyperlipemia     Past Surgical History:  Procedure Laterality Date  . CESAREAN SECTION      Family History  Problem Relation Age of Onset  . Diabetes Maternal Uncle   . Diabetes Maternal Uncle     Social History   Socioeconomic History  . Marital status: Single    Spouse name: Not on file  . Number of children: 4  . Years of education: Not on file  . Highest education level: Not on file  Occupational History  . 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 Smoking status: Never Smoker  . Smokeless tobacco: Never Used  Substance and Sexual Activity  . Alcohol use: No  . Drug use: No  . Sexual activity: Yes    Birth control/protection: Implant  Other Topics Concern  . Not on file  Social History Narrative  . Not on file   Social Determinants of Health   Financial Resource Strain:   . Difficulty of Paying Living Expenses:   Food Insecurity:   . Worried About Programme researcher, broadcasting/film/video in the Last Year:   . Barista in  the Last Year:   Transportation Needs:   . Freight forwarder (Medical):   Marland Kitchen Lack of Transportation (Non-Medical):   Physical Activity:   . Days of Exercise per Week:   . Minutes of Exercise per Session:   Stress:   . Feeling of Stress :   Social Connections:   . Frequency of Communication with Friends and Family:   . Frequency of Social Gatherings with Friends and Family:   . Attends Religious Services:   . Active Member of Clubs or  Organizations:   . Attends Banker Meetings:   Marland Kitchen Marital Status:      Observations/Objective: Awake, alert and oriented x 3   Review of Systems  Constitutional: Negative for fever, malaise/fatigue and weight loss.  HENT: Negative.  Negative for nosebleeds.   Eyes: Negative.  Negative for blurred vision, double vision and photophobia.  Respiratory: Negative.  Negative for cough and shortness of breath.   Cardiovascular: Negative.  Negative for chest pain, palpitations and leg swelling.  Gastrointestinal: Negative.  Negative for heartburn, nausea and vomiting.  Musculoskeletal: Negative.  Negative for myalgias.  Neurological: Negative.  Negative for dizziness, focal weakness, seizures and headaches.  Psychiatric/Behavioral: Negative.  Negative for suicidal ideas.    Assessment and Plan: Toleen was seen today for follow-up.  Diagnoses and all orders for this visit:  Type 2 diabetes mellitus without complication, without long-term current use of insulin (HCC) -     glipiZIDE (GLUCOTROL) 5 MG tablet; Take 1 tablet (5 mg total) by mouth 2 (two) times daily before a meal. -     metFORMIN (GLUCOPHAGE) 500 MG tablet; Take 2 tablets (1,000 mg total) by mouth 2 (two) times daily with a meal. -     lisinopril (ZESTRIL) 2.5 MG tablet; Take 1 tablet (2.5 mg total) by mouth daily. -     glucose blood (TRUE METRIX BLOOD GLUCOSE TEST) test strip; Use as instructed. Please fill as a 90 day supply -     TRUEplus Lancets 28G MISC; Use as prescribed. Please fill as a 90 day supply -     Hemoglobin A1c -     Basic metabolic panel Continue blood sugar control as discussed in office today, low carbohydrate diet, and regular physical exercise as tolerated, 150 minutes per week (30 min each day, 5 days per week, or 50 min 3 days per week). Keep blood sugar logs with fasting goal of 90-130 mg/dl, post prandial (after you eat) less than 180.  For Hypoglycemia: BS <60 and Hyperglycemia BS >400;  contact the clinic ASAP. Annual eye exams and foot exams are recommended.   Dyslipidemia, goal LDL below 70 -     pravastatin (PRAVACHOL) 40 MG tablet; Take 1 tablet (40 mg total) by mouth daily. INSTRUCTIONS: Work on a low fat, heart healthy diet and participate in regular aerobic exercise program by working out at least 150 minutes per week; 5 days a week-30 minutes per day. Avoid red meat/beef/steak,  fried foods. junk foods, sodas, sugary drinks, unhealthy snacking, alcohol and smoking.  Drink at least 80 oz of water per day and monitor your carbohydrate intake daily.      Follow Up Instructions Return in about 3 months (around 05/16/2020).     I discussed the assessment and treatment plan with the patient. The patient was provided an opportunity to ask questions and all were answered. The patient agreed with the plan and demonstrated an understanding of the instructions.   The patient was  advised to call back or seek an in-person evaluation if the symptoms worsen or if the condition fails to improve as anticipated.  I provided 14 minutes of non-face-to-face time during this encounter including median intraservice time, reviewing previous notes, labs, imaging, medications and explaining diagnosis and management.  Claiborne Rigg, FNP-BC

## 2020-02-14 NOTE — Progress Notes (Signed)
10/22/19 2nd

## 2020-02-15 MED FILL — METFORMIN HCL 500 MG TABS: 500 | 90 days supply | Qty: 360 | Fill #0

## 2020-02-15 MED FILL — glipiZIDE 5 MG TABS: 5 | 90 days supply | Qty: 180 | Fill #0

## 2020-02-15 MED FILL — TRUEplus LANCETS 28G MISC: 75 days supply | Qty: 300 | Fill #0

## 2020-02-15 MED FILL — LISINOPRIL 2.5 MG TABLET: 2.5 | 90 days supply | Qty: 90 | Fill #0

## 2020-02-15 MED FILL — PRAVASTATIN NA 40 MG TAB: 40 | 90 days supply | Qty: 90 | Fill #0

## 2020-02-15 MED FILL — TRUE METRIX TEST STRIP: 75 days supply | Qty: 300 | Fill #0

## 2020-02-17 ENCOUNTER — Other Ambulatory Visit: Payer: Self-pay

## 2020-02-17 ENCOUNTER — Ambulatory Visit: Payer: No Typology Code available for payment source | Attending: Nurse Practitioner

## 2020-02-18 ENCOUNTER — Other Ambulatory Visit: Payer: Self-pay | Admitting: Nurse Practitioner

## 2020-02-18 DIAGNOSIS — E119 Type 2 diabetes mellitus without complications: Secondary | ICD-10-CM

## 2020-02-18 LAB — BASIC METABOLIC PANEL
BUN/Creatinine Ratio: 19 (ref 9–23)
BUN: 11 mg/dL (ref 6–24)
CO2: 22 mmol/L (ref 20–29)
Calcium: 9.5 mg/dL (ref 8.7–10.2)
Chloride: 104 mmol/L (ref 96–106)
Creatinine, Ser: 0.58 mg/dL (ref 0.57–1.00)
GFR calc Af Amer: 125 mL/min/{1.73_m2} (ref >59)
GFR calc non Af Amer: 109 mL/min/{1.73_m2} (ref >59)
Glucose: 113 mg/dL — ABNORMAL HIGH (ref 65–99)
Potassium: 4.3 mmol/L (ref 3.5–5.2)
Sodium: 141 mmol/L (ref 134–144)

## 2020-02-18 LAB — HEMOGLOBIN A1C
Est. average glucose Bld gHb Est-mCnc: 157 mg/dL
Hgb A1c MFr Bld: 7.1 % — ABNORMAL HIGH (ref 4.8–5.6)

## 2020-02-18 MED ORDER — GLIPIZIDE 10 MG PO TABS: 10.0000 mg | ORAL_TABLET | Freq: Two times a day (BID) | ORAL | 0 refills | Status: DC

## 2020-02-20 MED FILL — glipiZIDE 10 MG TABS: 10 | 90 days supply | Qty: 180 | Fill #0

## 2020-04-13 MED FILL — glipiZIDE 10 MG TABS: 10 | 90 days supply | Qty: 180 | Fill #0

## 2020-05-16 ENCOUNTER — Ambulatory Visit: Payer: No Typology Code available for payment source | Admitting: Nurse Practitioner

## 2020-05-22 ENCOUNTER — Other Ambulatory Visit: Payer: Self-pay | Admitting: Nurse Practitioner

## 2020-05-22 DIAGNOSIS — E119 Type 2 diabetes mellitus without complications: Secondary | ICD-10-CM

## 2020-05-22 MED FILL — LISINOPRIL 2.5 MG TABLET: 2.5 | 90 days supply | Qty: 90 | Fill #1

## 2020-05-22 MED FILL — TRUEplus LANCETS 28G MISC: 90 days supply | Qty: 100 | Fill #1

## 2020-05-22 MED FILL — PRAVASTATIN NA 40 MG TAB: 40 | 90 days supply | Qty: 90 | Fill #1

## 2020-05-22 MED FILL — METFORMIN HCL 500 MG TABS: 500 | 90 days supply | Qty: 360 | Fill #1

## 2020-05-22 MED FILL — TRUE METRIX TEST STRIP: 90 days supply | Qty: 100 | Fill #1

## 2020-05-22 NOTE — Telephone Encounter (Signed)
Requested medication (s) are due for refill today: yes  Requested medication (s) are on the active medication list:yes  Last refill:  02/18/20  #180  0 refills  Future visit scheduled: yes  Notes to clinic:  Expired 05/18/20 end date    Requested Prescriptions  Pending Prescriptions Disp Refills   glipiZIDE (GLUCOTROL) 10 MG tablet [Pharmacy Med Name: glipiZIDE 10 MG TABS 10 Tablet] 180 tablet 0    Sig: Take 1 tablet (10 mg total) by mouth 2 (two) times daily before a meal.      Endocrinology:  Diabetes - Sulfonylureas Passed - 05/22/2020  4:38 PM      Passed - HBA1C is between 0 and 7.9 and within 180 days    Hemoglobin A1c  Date Value Ref Range Status  08/16/2015 7.4 (H) 4.8 - 5.6 % Final    Comment:             Pre-diabetes: 5.7 - 6.4          Diabetes: >6.4          Glycemic control for adults with diabetes: <7.0    Hgb A1c MFr Bld  Date Value Ref Range Status  02/17/2020 7.1 (H) 4.8 - 5.6 % Final    Comment:             Prediabetes: 5.7 - 6.4          Diabetes: >6.4          Glycemic control for adults with diabetes: <7.0           Passed - Valid encounter within last 6 months    Recent Outpatient Visits           3 months ago Type 2 diabetes mellitus without complication, without long-term current use of insulin (HCC)   [facility] [place], [place] W, NP   6 months ago Type 2 diabetes mellitus without complication, without long-term current use of insulin (HCC)   [facility] [place], [place] W, NP   7 months ago Type 2 diabetes mellitus without complication, without long-term current use of insulin [facility])   [facility] [place], [place] W, NP   10 months ago Type 2 diabetes mellitus without complication, without long-term current use of insulin [facility])   [facility] [place], [place] Stakes, NP   1 year ago Encounter for Papanicolaou smear for cervical cancer  screening   [facility] [place], [place] Stakes, NP       Future Appointments             In 4 weeks Claiborne Rigg, NP [employer] [facility]

## 2020-05-24 ENCOUNTER — Other Ambulatory Visit: Payer: Self-pay | Admitting: Family Medicine

## 2020-06-18 ENCOUNTER — Other Ambulatory Visit: Payer: Self-pay

## 2020-06-18 ENCOUNTER — Ambulatory Visit: Payer: Self-pay

## 2020-06-19 ENCOUNTER — Other Ambulatory Visit: Payer: Self-pay | Admitting: Nurse Practitioner

## 2020-06-19 ENCOUNTER — Ambulatory Visit: Payer: Self-pay | Attending: Nurse Practitioner | Admitting: Nurse Practitioner

## 2020-06-19 ENCOUNTER — Encounter: Payer: Self-pay | Admitting: Nurse Practitioner

## 2020-06-19 ENCOUNTER — Other Ambulatory Visit: Payer: Self-pay

## 2020-06-19 VITALS — BP 106/71 | HR 72 | Temp 97.6°F | Ht 63.0 in | Wt 133.0 lb

## 2020-06-19 DIAGNOSIS — E119 Type 2 diabetes mellitus without complications: Secondary | ICD-10-CM

## 2020-06-19 DIAGNOSIS — G8929 Other chronic pain: Secondary | ICD-10-CM

## 2020-06-19 DIAGNOSIS — Z23 Encounter for immunization: Secondary | ICD-10-CM

## 2020-06-19 DIAGNOSIS — M25512 Pain in left shoulder: Secondary | ICD-10-CM

## 2020-06-19 DIAGNOSIS — E785 Hyperlipidemia, unspecified: Secondary | ICD-10-CM

## 2020-06-19 DIAGNOSIS — R7989 Other specified abnormal findings of blood chemistry: Secondary | ICD-10-CM

## 2020-06-19 DIAGNOSIS — Z1211 Encounter for screening for malignant neoplasm of colon: Secondary | ICD-10-CM

## 2020-06-19 DIAGNOSIS — Z1159 Encounter for screening for other viral diseases: Secondary | ICD-10-CM

## 2020-06-19 DIAGNOSIS — Z124 Encounter for screening for malignant neoplasm of cervix: Secondary | ICD-10-CM

## 2020-06-19 LAB — POCT GLYCOSYLATED HEMOGLOBIN (HGB A1C): Hemoglobin A1C: 7.1 % — AB (ref 4.0–5.6)

## 2020-06-19 LAB — GLUCOSE, POCT (MANUAL RESULT ENTRY): POC Glucose: 171 mg/dl — AB (ref 70–99)

## 2020-06-19 MED ORDER — GLIPIZIDE 10 MG PO TABS: 10.0000 mg | ORAL_TABLET | Freq: Two times a day (BID) | ORAL | 0 refills | Status: DC

## 2020-06-19 MED ORDER — TRUE METRIX BLOOD GLUCOSE TEST VI STRP: ORAL_STRIP | 12 refills | Status: DC

## 2020-06-19 MED ORDER — PRAVASTATIN SODIUM 40 MG PO TABS: 40.0000 mg | ORAL_TABLET | Freq: Every day | ORAL | 1 refills | Status: DC

## 2020-06-19 MED ORDER — LISINOPRIL 2.5 MG PO TABS: 2.5000 mg | ORAL_TABLET | Freq: Every day | ORAL | 2 refills | Status: DC

## 2020-06-19 MED ORDER — METFORMIN HCL 500 MG PO TABS: 1000.0000 mg | ORAL_TABLET | Freq: Two times a day (BID) | ORAL | 1 refills | Status: DC

## 2020-06-19 MED ORDER — DICLOFENAC SODIUM 1 % EX GEL: 2.0000 g | Freq: Four times a day (QID) | CUTANEOUS | 1 refills | Status: DC

## 2020-06-19 MED ORDER — TRUE METRIX METER W/DEVICE KIT: PACK | 0 refills | Status: AC

## 2020-06-19 MED ORDER — TRUEPLUS LANCETS 28G MISC: 6 refills | Status: AC

## 2020-06-19 MED FILL — DICLOFENAC SODIUM 1% GEL: 1 | 12 days supply | Qty: 100 | Fill #0

## 2020-06-19 NOTE — Progress Notes (Signed)
Assessment & Plan:  Heidi Shaw was seen today for follow-up.  Diagnoses and all orders for this visit:  Type 2 diabetes mellitus without complication, without long-term current use of insulin (HCC) -     Glucose (CBG) -     HgB A1c -     Microalbumin/Creatinine Ratio, Urine -     metFORMIN (GLUCOPHAGE) 500 MG tablet; Take 2 tablets (1,000 mg total) by mouth 2 (two) times daily with a meal. -     lisinopril (ZESTRIL) 2.5 MG tablet; Take 1 tablet (2.5 mg total) by mouth daily. -     glipiZIDE (GLUCOTROL) 10 MG tablet; Take 1 tablet (10 mg total) by mouth 2 (two) times daily before a meal. Please fill as 90 day supply -     glucose blood (TRUE METRIX BLOOD GLUCOSE TEST) test strip; Use as instructed. Please fill as a 90 day supply -     TRUEplus Lancets 28G MISC; Use as prescribed. Please fill as a 90 day supply -     Blood Glucose Monitoring Suppl (TRUE METRIX METER) w/Device KIT; Use to check blood sugars once a day. NEEDS NEW METER. METER NOT ACCURATE -     CMP14+EGFR Continue blood sugar control as discussed in office today, low carbohydrate diet, and regular physical exercise as tolerated, 150 minutes per week (30 min each day, 5 days per week, or 50 min 3 days per week). Keep blood sugar logs with fasting goal of 90-130 mg/dl, post prandial (after you eat) less than 180.  For Hypoglycemia: BS <60 and Hyperglycemia BS >400; contact the clinic ASAP. Annual eye exams and foot exams are recommended.   Chronic left shoulder pain -     diclofenac Sodium (VOLTAREN) 1 % GEL; Apply 2 g topically 4 (four) times daily.  Dyslipidemia, goal LDL below 70 -     pravastatin (PRAVACHOL) 40 MG tablet; Take 1 tablet (40 mg total) by mouth daily. INSTRUCTIONS: Work on a low fat, heart healthy diet and participate in regular aerobic exercise program by working out at least 150 minutes per week; 5 days a week-30 minutes per day. Avoid red meat/beef/steak,  fried foods. junk foods, sodas, sugary drinks,  unhealthy snacking, alcohol and smoking.  Drink at least 80 oz of water per day and monitor your carbohydrate intake daily.    Need for hepatitis C screening test -     Hepatitis C Antibody  Colon cancer screening -     Fecal occult blood, imunochemical([facility])  Abnormal CBC -     CBC  Need for immunization against influenza -     Flu Vaccine QUAD 36+ mos IM    Patient has been counseled on age-appropriate routine health concerns for screening and prevention. These are reviewed and up-to-date. Referrals have been placed accordingly. Immunizations are up-to-date or declined.    Subjective:   Chief Complaint  Patient presents with  . Follow-up    Pt. is here for 3 months F/U on diabetes.    HPI Heidi Shaw 49 y.o. female presents to office today for follow up.  has a past medical history of Anemia, Diabetes mellitus without complication ([place]), Heart murmur, and Hyperlipemia.  VRI was used to communicate directly with patient for the entire encounter including providing detailed patient instructions.    DM TYPE 2 She is currently taking glipizide 10 mg BID, metformin 1000 mg BID. On renal dose ACE and Statin. LDL not at goal despite her endorsement of taking pravastatin 40 mg daily.  Blood pressure is well controlled. She has her meter with her today with the following readings.   7 day average 179 14 day average 177 21 day average 175 Wed discussed her carb intake. She is eating oatmeal and papaya for breakfast, tortillas, rice in the evenings. Lab Results  Component Value Date   HGBA1C 7.1 (A) 06/19/2020   Lab Results  Component Value Date   HGBA1C 7.1 (H) 02/17/2020   Lab Results  Component Value Date   LDLCALC 116 (H) 08/15/2019   BP Readings from Last 3 Encounters:  06/19/20 106/71  11/15/19 109/71  05/20/19 113/75    Shoulder Pain Left shoulder pain worse at night. Ongoing for several weeks now. She does endorses lying on her left side at  night routinely however she has not been able to lately due to the pain in her shoulder. She can raise her arm above head and denies any numbness or tingling in her neck or radiating down her arm.       Review of Systems  Constitutional: Negative for fever, malaise/fatigue and weight loss.  HENT: Negative.  Negative for nosebleeds.   Eyes: Negative.  Negative for blurred vision, double vision and photophobia.  Respiratory: Negative.  Negative for cough and shortness of breath.   Cardiovascular: Negative.  Negative for chest pain, palpitations and leg swelling.  Gastrointestinal: Negative.  Negative for heartburn, nausea and vomiting.  Musculoskeletal: Positive for joint pain. Negative for myalgias.  Neurological: Negative.  Negative for dizziness, focal weakness, seizures and headaches.  Psychiatric/Behavioral: Negative.  Negative for suicidal ideas.    Past Medical History:  Diagnosis Date  . Anemia   . Diabetes mellitus without complication ([place])   . Heart murmur   . Hyperlipemia     Past Surgical History:  Procedure Laterality Date  . CESAREAN SECTION      Family History  Problem Relation Age of Onset  . Diabetes Maternal Uncle   . Diabetes Maternal Uncle     Social History Reviewed with no changes to be made today.   Outpatient Medications Prior to Visit  Medication Sig Dispense Refill  . aspirin 81 MG tablet Take 1 tablet (81 mg total) by mouth daily. Reported on 08/09/2015 90 tablet 4  . fluticasone (FLONASE) 50 MCG/ACT nasal spray Place 2 sprays into both nostrils daily. 16 g 6  . mupirocin ointment (BACTROBAN) 2 % Apply 1 application topically 2 (two) times daily. 30 g 0  . olopatadine (PATANOL) 0.1 % ophthalmic solution Place 1 drop into the left eye 2 (two) times daily. 5 mL 12  . Blood Glucose Monitoring Suppl (TRUE METRIX METER) w/Device KIT Use to check blood sugars once a day. 1 kit 0  . glipiZIDE (GLUCOTROL) 10 MG tablet TAKE 1 TABLET (10 MG TOTAL) BY MOUTH 2  (TWO) TIMES DAILY BEFORE A MEAL. 180 tablet 0  . glucose blood (TRUE METRIX BLOOD GLUCOSE TEST) test strip Use as instructed. Please fill as a 90 day supply 200 each 12  . lisinopril (ZESTRIL) 2.5 MG tablet Take 1 tablet (2.5 mg total) by mouth daily. 90 tablet 2  . TRUEplus Lancets 28G MISC Use as prescribed. Please fill as a 90 day supply 200 each 6  . metFORMIN (GLUCOPHAGE) 500 MG tablet Take 2 tablets (1,000 mg total) by mouth 2 (two) times daily with a meal. 360 tablet 1  . pravastatin (PRAVACHOL) 40 MG tablet Take 1 tablet (40 mg total) by mouth daily. 90 tablet 1   No  facility-administered medications prior to visit.    No Known Allergies     Objective:    BP 106/71 (BP Location: Left Arm, Patient Position: Sitting, Cuff Size: Normal)   Pulse 72   Temp 97.6 F (36.4 C) (Temporal)   Ht _0  (1.6 m)   Wt 133 lb (60.3 kg)   SpO2 98%   BMI 23.56 kg/m  Wt Readings from Last 3 Encounters:  06/19/20 133 lb (60.3 kg)  11/15/19 135 lb (61.2 kg)  05/20/19 132 lb (59.9 kg)    Physical Exam Vitals and nursing note reviewed.  Constitutional:      Appearance: She is well-developed.  HENT:     Head: Normocephalic and atraumatic.  Cardiovascular:     Rate and Rhythm: Normal rate and regular rhythm.     Heart sounds: Normal heart sounds. No murmur heard.  No friction rub. No gallop.   Pulmonary:     Effort: Pulmonary effort is normal. No tachypnea or respiratory distress.     Breath sounds: Normal breath sounds. No decreased breath sounds, wheezing, rhonchi or rales.  Chest:     Chest wall: No tenderness.  Abdominal:     General: Bowel sounds are normal.     Palpations: Abdomen is soft.  Musculoskeletal:        General: No swelling, tenderness or deformity. Normal range of motion.     Cervical back: Normal range of motion.  Skin:    General: Skin is warm and dry.  Neurological:     Mental Status: She is alert and oriented to person, place, and time.     Coordination:  Coordination normal.  Psychiatric:        Behavior: Behavior normal. Behavior is cooperative.        Thought Content: Thought content normal.        Judgment: Judgment normal.          Patient has been counseled extensively about nutrition and exercise as well as the importance of adherence with medications and regular follow-up. The patient was given clear instructions to go to ER or return to medical center if symptoms don't improve, worsen or new problems develop. The patient verbalized understanding.   Follow-up: Return in about 3 months (around 09/19/2020).   Gildardo Pounds, FNP-BC [facility] [place], [place]   06/19/2020, 8:51 PM

## 2020-06-20 LAB — CMP14+EGFR
ALT: 12 IU/L (ref 0–32)
AST: 12 IU/L (ref 0–40)
Albumin/Globulin Ratio: 1.9 (ref 1.2–2.2)
Albumin: 4.6 g/dL (ref 3.8–4.8)
Alkaline Phosphatase: 88 IU/L (ref 44–121)
BUN/Creatinine Ratio: 8 — ABNORMAL LOW (ref 9–23)
BUN: 7 mg/dL (ref 6–24)
Bilirubin Total: <0.2 mg/dL (ref 0.0–1.2)
CO2: 22 mmol/L (ref 20–29)
Calcium: 9.1 mg/dL (ref 8.7–10.2)
Chloride: 105 mmol/L (ref 96–106)
Creatinine, Ser: 0.83 mg/dL (ref 0.57–1.00)
GFR calc Af Amer: 95 mL/min/{1.73_m2} (ref >59)
GFR calc non Af Amer: 82 mL/min/{1.73_m2} (ref >59)
Globulin, Total: 2.4 g/dL (ref 1.5–4.5)
Glucose: 146 mg/dL — ABNORMAL HIGH (ref 65–99)
Potassium: 4.2 mmol/L (ref 3.5–5.2)
Sodium: 140 mmol/L (ref 134–144)
Total Protein: 7 g/dL (ref 6.0–8.5)

## 2020-06-20 LAB — CBC
Hematocrit: 36.7 % (ref 34.0–46.6)
Hemoglobin: 12 g/dL (ref 11.1–15.9)
MCH: 26.5 pg — ABNORMAL LOW (ref 26.6–33.0)
MCHC: 32.7 g/dL (ref 31.5–35.7)
MCV: 81 fL (ref 79–97)
Platelets: 290 10*3/uL (ref 150–450)
RBC: 4.52 x10E6/uL (ref 3.77–5.28)
RDW: 13.9 % (ref 11.7–15.4)
WBC: 7 10*3/uL (ref 3.4–10.8)

## 2020-06-20 LAB — MICROALBUMIN / CREATININE URINE RATIO
Creatinine, Urine: 55.7 mg/dL
Microalb/Creat Ratio: <5 mg/g creat (ref 0–29)
Microalbumin, Urine: <3 ug/mL

## 2020-06-20 LAB — HEPATITIS C ANTIBODY: Hep C Virus Ab: <0.1 s/co ratio (ref 0.0–0.9)

## 2020-06-23 LAB — FECAL OCCULT BLOOD, IMMUNOCHEMICAL: Fecal Occult Bld: NEGATIVE

## 2020-06-26 ENCOUNTER — Encounter (HOSPITAL_COMMUNITY): Payer: Self-pay | Admitting: Emergency Medicine

## 2020-06-26 ENCOUNTER — Emergency Department (HOSPITAL_COMMUNITY)
Admission: EM | Admit: 2020-06-26 | Discharge: 2020-06-26 | Disposition: A | Discharge status: Home / Self Care | Payer: No Typology Code available for payment source | Attending: Emergency Medicine | Admitting: Emergency Medicine

## 2020-06-26 ENCOUNTER — Other Ambulatory Visit (HOSPITAL_COMMUNITY): Payer: Self-pay | Admitting: Emergency Medicine

## 2020-06-26 ENCOUNTER — Other Ambulatory Visit: Payer: Self-pay

## 2020-06-26 DIAGNOSIS — E119 Type 2 diabetes mellitus without complications: Secondary | ICD-10-CM | POA: Insufficient documentation

## 2020-06-26 DIAGNOSIS — M25512 Pain in left shoulder: Secondary | ICD-10-CM | POA: Insufficient documentation

## 2020-06-26 DIAGNOSIS — M79602 Pain in left arm: Secondary | ICD-10-CM | POA: Insufficient documentation

## 2020-06-26 DIAGNOSIS — M792 Neuralgia and neuritis, unspecified: Secondary | ICD-10-CM

## 2020-06-26 DIAGNOSIS — Z7984 Long term (current) use of oral hypoglycemic drugs: Secondary | ICD-10-CM | POA: Insufficient documentation

## 2020-06-26 DIAGNOSIS — Z7982 Long term (current) use of aspirin: Secondary | ICD-10-CM | POA: Insufficient documentation

## 2020-06-26 MED ORDER — PREDNISONE 10 MG PO TABS: 20.0000 mg | ORAL_TABLET | Freq: Two times a day (BID) | ORAL | 0 refills | Status: DC

## 2020-06-26 MED ORDER — TRAMADOL HCL 50 MG PO TABS
50.0000 mg | ORAL_TABLET | Freq: Four times a day (QID) | ORAL | 0 refills | Status: DC | PRN
Start: 2020-06-26 — End: 2020-06-26

## 2020-06-26 NOTE — ED Triage Notes (Signed)
Patient arrives to ED with complaints of pain to left shoulder and arm x1 month. States the pain is dull. Denies SOB or chest pain. States lately the pain has been more intensive at night. EKG obtained in triage.

## 2020-06-26 NOTE — Progress Notes (Signed)
Orthopedic Tech Progress Note Patient Details:  Avalie Oconnor Feb 17, 1971 [id number]  Ortho Devices Type of Ortho Device: Sling immobilizer Ortho Device/Splint Location: Left Upper Extremity Ortho Device/Splint Interventions: Ordered, Application, Adjustment   Post Interventions Patient Tolerated: Well Instructions Provided: Adjustment of device, Care of device, Poper ambulation with device   Alessandria Henken P Harle Stanford 06/26/2020, 8:29 PM

## 2020-06-26 NOTE — ED Provider Notes (Signed)
[place] EMERGENCY DEPARTMENT Provider Note   CSN: [id number] Arrival date & time: 06/26/20  1637     History Chief Complaint  Patient presents with  . Arm Pain    Heidi Shaw is a 49 y.o. female.  Patient is a 49 year old female with history of diabetes, hyperlipidemia.  She presents today for evaluation of left shoulder and arm pain.  This has been ongoing for the past month.  It began in the absence of any injury or trauma.  She denies any numbness or tingling.  She states her pain is worse at night when she tries to sleep.  There are no alleviating factors.  She was treated by an outside clinic with nsaids with no relief.  Patient speaks very little [place], mainly [place].  History was taken with the assistance of the translator tablet.  The history is provided by the patient.       Past Medical History:  Diagnosis Date  . Anemia   . Diabetes mellitus without complication ([place])   . Heart murmur   . Hyperlipemia     Patient Active Problem List   Diagnosis Date Noted  . Type 2 diabetes mellitus ([place]) 08/29/2015    Past Surgical History:  Procedure Laterality Date  . CESAREAN SECTION       OB History    Gravida  4   Para  4   Term  4   Preterm      AB      Living  4     SAB      TAB      Ectopic      Multiple      Live Births              Family History  Problem Relation Age of Onset  . Diabetes Maternal Uncle   . Diabetes Maternal Uncle     Social History   Tobacco Use  . Smoking status: Never Smoker  . Smokeless tobacco: Never Used  Substance Use Topics  . Alcohol use: No  . Drug use: No    Home Medications Prior to Admission medications   Medication Sig Start Date End Date Taking? Authorizing Provider  aspirin 81 MG tablet Take 1 tablet (81 mg total) by mouth daily. Reported on 08/09/2015 02/16/17   Tonette Bihari, MD  Blood Glucose Monitoring Suppl (TRUE METRIX METER) w/Device KIT Use to  check blood sugars once a day. NEEDS NEW METER. METER NOT ACCURATE 06/19/20   Gildardo Pounds, NP  diclofenac Sodium (VOLTAREN) 1 % GEL Apply 2 g topically 4 (four) times daily. 06/19/20 07/19/20  Gildardo Pounds, NP  fluticasone (FLONASE) 50 MCG/ACT nasal spray Place 2 sprays into both nostrils daily. 04/15/19   Gildardo Pounds, NP  glipiZIDE (GLUCOTROL) 10 MG tablet Take 1 tablet (10 mg total) by mouth 2 (two) times daily before a meal. Please fill as 90 day supply 06/19/20 09/17/20  Gildardo Pounds, NP  glucose blood (TRUE METRIX BLOOD GLUCOSE TEST) test strip Use as instructed. Please fill as a 90 day supply 06/19/20   Gildardo Pounds, NP  lisinopril (ZESTRIL) 2.5 MG tablet Take 1 tablet (2.5 mg total) by mouth daily. 06/19/20   Gildardo Pounds, NP  metFORMIN (GLUCOPHAGE) 500 MG tablet Take 2 tablets (1,000 mg total) by mouth 2 (two) times daily with a meal. 06/19/20 09/17/20  Gildardo Pounds, NP  mupirocin ointment (BACTROBAN) 2 % Apply 1 application topically  2 (two) times daily. 10/12/18   Gildardo Pounds, NP  olopatadine (PATANOL) 0.1 % ophthalmic solution Place 1 drop into the left eye 2 (two) times daily. 04/15/19   Gildardo Pounds, NP  pravastatin (PRAVACHOL) 40 MG tablet Take 1 tablet (40 mg total) by mouth daily. 06/19/20 09/17/20  Gildardo Pounds, NP  TRUEplus Lancets 28G MISC Use as prescribed. Please fill as a 90 day supply 06/19/20   Gildardo Pounds, NP    Allergies    Patient has no known allergies.  Review of Systems   Review of Systems  All other systems reviewed and are negative.   Physical Exam Updated Vital Signs BP (!) 143/84   Pulse 70   Temp 98.5 F (36.9 C) (Oral)   Resp 16   Ht _0  (1.6 m)   Wt 60.3 kg   SpO2 99%   BMI 23.56 kg/m   Physical Exam Vitals and nursing note reviewed.  Constitutional:      General: She is not in acute distress.    Appearance: Normal appearance. She is not ill-appearing.  HENT:     Head: Normocephalic and  atraumatic.  Pulmonary:     Effort: Pulmonary effort is normal.  Musculoskeletal:     Comments: Left shoulder appears grossly normal.  She has good range of motion.  Ulnar and radial pulses are easily palpable and motor and sensation are intact throughout the entire hand.  Skin:    General: Skin is warm and dry.  Neurological:     Mental Status: She is alert and oriented to person, place, and time.     ED Results / Procedures / Treatments   Labs (all labs ordered are listed, but only abnormal results are displayed) Labs Reviewed - No data to display  EKG None  Radiology No results found.  Procedures Procedures (including critical care time)  Medications Ordered in ED Medications - No data to display  ED Course  I have reviewed the triage vital signs and the nursing notes.  Pertinent labs & imaging results that were available during my care of the patient were reviewed by me and considered in my medical decision making (see chart for details).    MDM Rules/Calculators/A&P  Patient with what appears to be a rotator cuff tendinitis vs radiculopathy.  Patient will be placed in an arm sling and treated with steroids/tramadol.  She is to follow-up with primary doctor if symptoms not improving in the next week.  Final Clinical Impression(s) / ED Diagnoses Final diagnoses:  None    Rx / DC Orders ED Discharge Orders    None       Veryl Speak, MD 06/27/20 1530

## 2020-06-26 NOTE — ED Notes (Signed)
PTAR called @ 1648-per Eulis Foster, RN called by Marylene Land

## 2020-06-26 NOTE — Discharge Instructions (Addendum)
Begin taking prednisone as prescribed.  And tramadol as prescribed as needed for pain.  Wear arm sling for the next several days, then slowly begin to reintroduce activity.  Follow-up with your primary doctor in the next 1 to 2 weeks if not improving to discuss physical therapy or possibly imaging studies.

## 2020-06-27 MED FILL — traMADol HCL 50 MG TABS: 50 | 3 days supply | Qty: 15 | Fill #0

## 2020-06-27 MED FILL — ?predniSONE 10MG TABS: 10 | 5 days supply | Qty: 20 | Fill #0

## 2020-07-10 MED FILL — glipiZIDE 10 MG TABS: 10 | 90 days supply | Qty: 180 | Fill #0

## 2020-08-08 ENCOUNTER — Other Ambulatory Visit: Payer: Self-pay | Admitting: Nurse Practitioner

## 2020-08-08 DIAGNOSIS — Z1231 Encounter for screening mammogram for malignant neoplasm of breast: Secondary | ICD-10-CM

## 2020-08-14 MED FILL — METFORMIN HCL 500 MG TABS: 500 | 30 days supply | Qty: 120 | Fill #0

## 2020-08-14 MED FILL — PRAVASTATIN NA 40 MG TAB: 40 | 30 days supply | Qty: 30 | Fill #0

## 2020-08-14 MED FILL — LISINOPRIL 2.5 MG TABLET: 2.5 | 90 days supply | Qty: 90 | Fill #0

## 2020-08-14 MED FILL — glipiZIDE 10 MG TABS: 10 | 30 days supply | Qty: 60 | Fill #0

## 2020-09-06 ENCOUNTER — Encounter: Payer: Self-pay | Admitting: Obstetrics and Gynecology

## 2020-09-13 MED FILL — glipiZIDE 10 MG TABS: 10 | 60 days supply | Qty: 120 | Fill #1

## 2020-09-13 MED FILL — PRAVASTATIN NA 40 MG TAB: 40 | 90 days supply | Qty: 90 | Fill #1

## 2020-09-13 MED FILL — METFORMIN HCL 500 MG TABS: 500 | 90 days supply | Qty: 360 | Fill #1

## 2020-09-21 ENCOUNTER — Ambulatory Visit: Payer: No Typology Code available for payment source | Admitting: Nurse Practitioner

## 2020-10-26 IMAGING — MG DIGITAL SCREENING BILAT W/ TOMO W/ CAD
8 series · 9 of 24 positions shown · non-contrast
Comparison: Previous exam(s).

CLINICAL DATA: Screening.

EXAM:
DIGITAL SCREENING BILATERAL MAMMOGRAM WITH TOMO AND CAD

[L CC synth-2D]
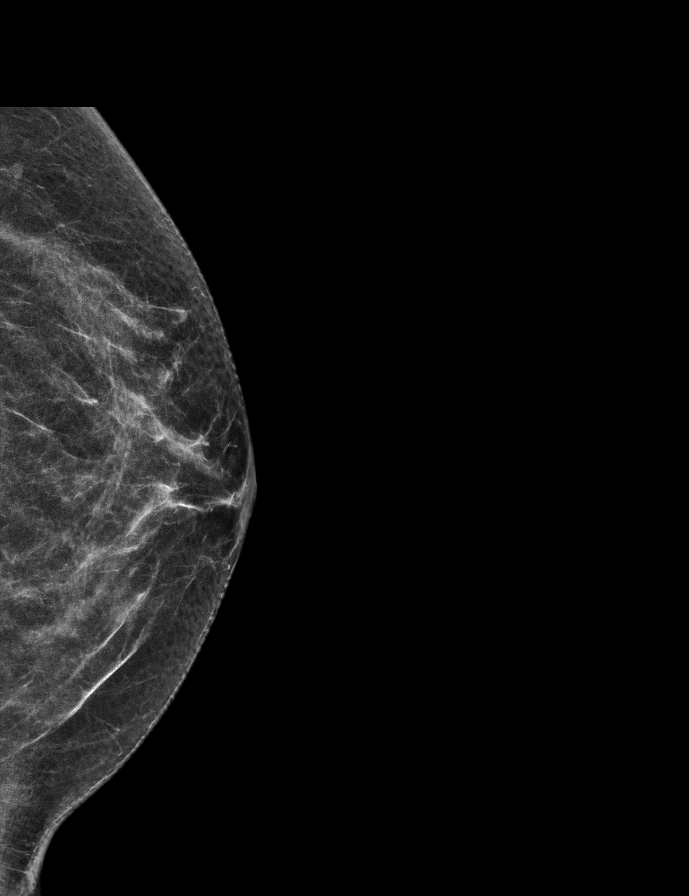

[L MLO synth-2D]
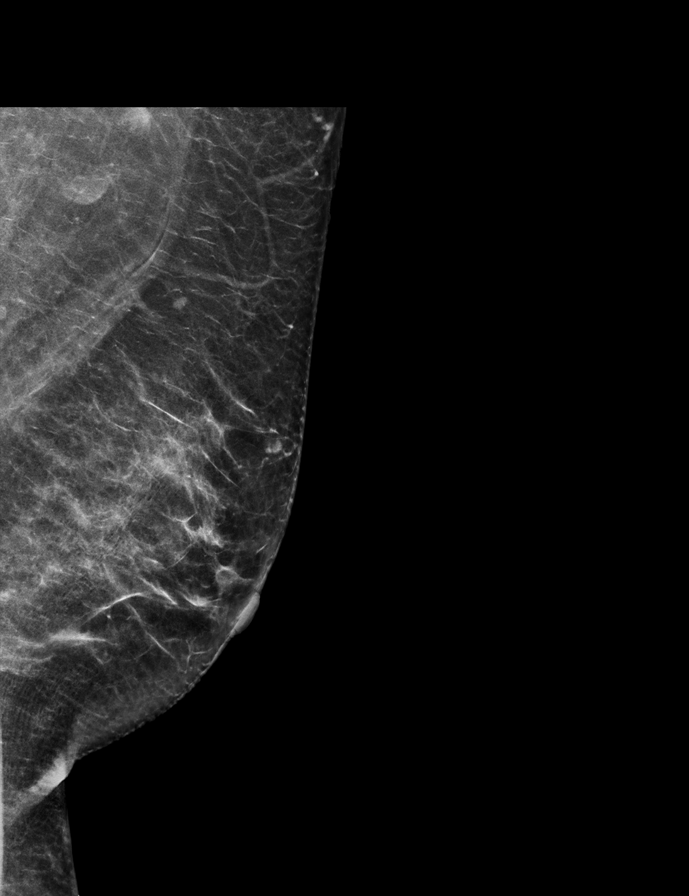

[R MLO synth-2D]
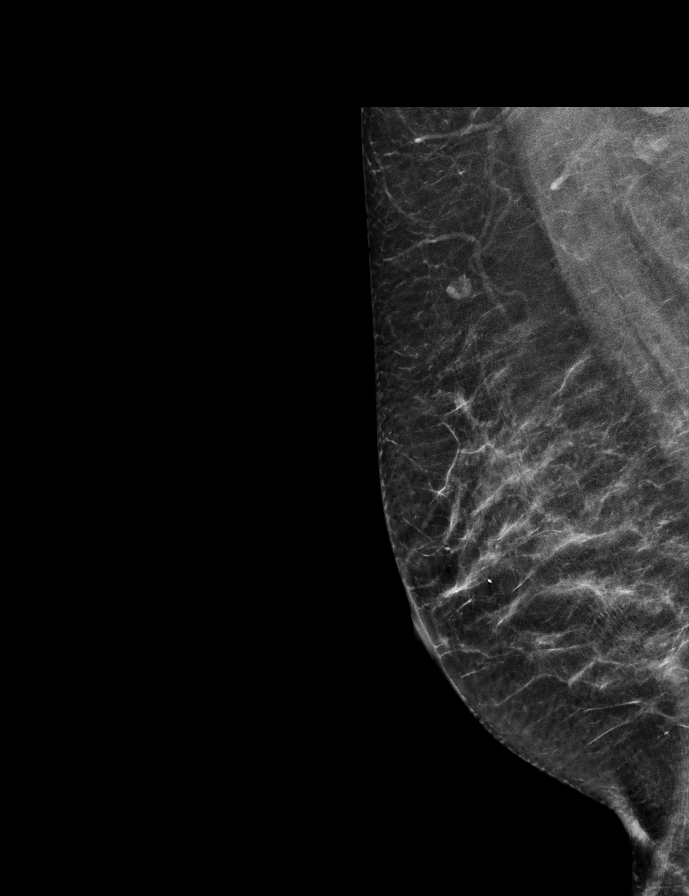

[R CC synth-2D]
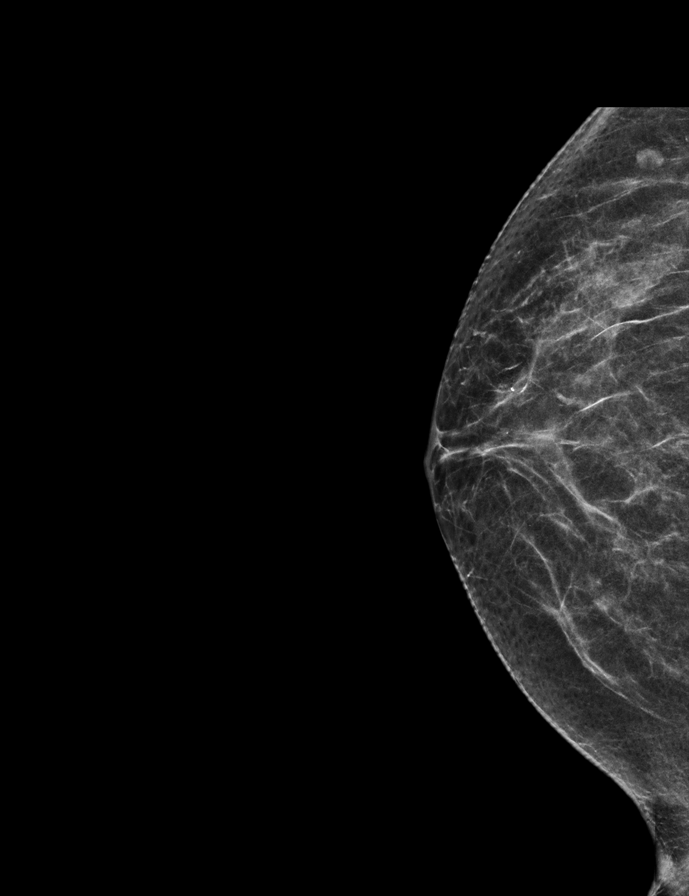

[L CC tomo · 2 of 58 frames shown]
[frame 19/58]
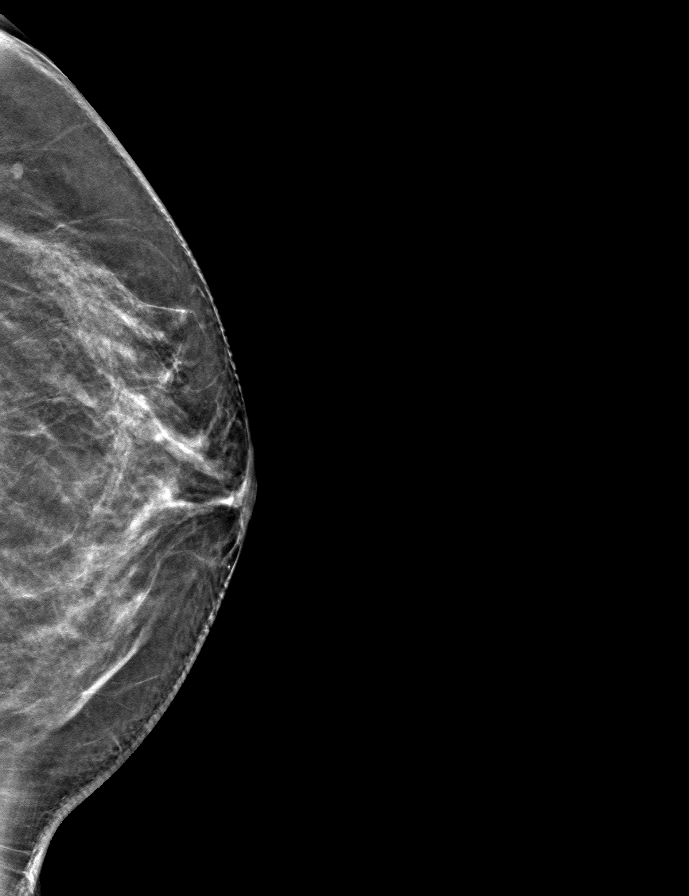
[frame 29/58]
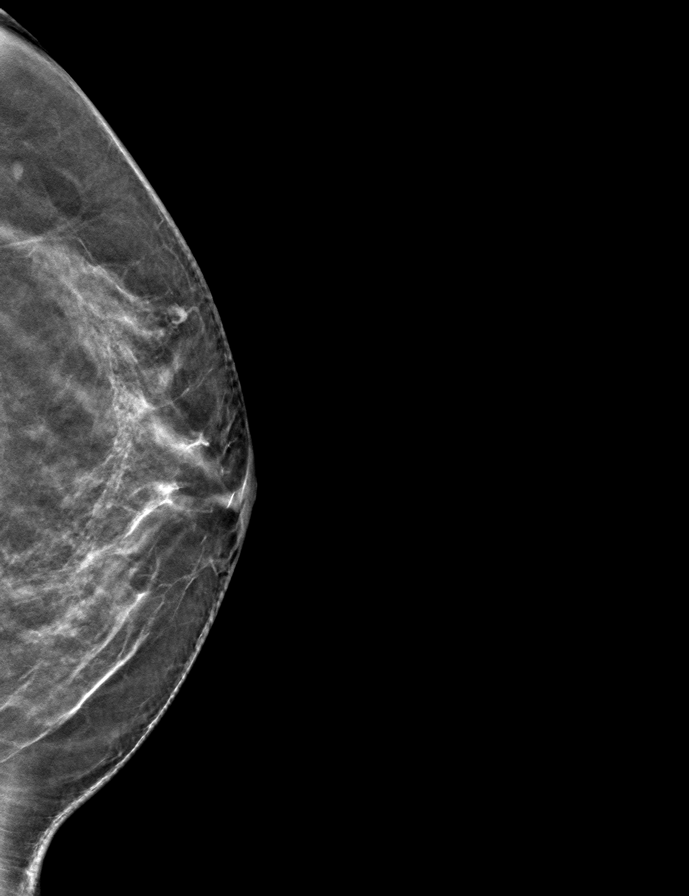

[R MLO tomo · tomo slice 31/60.0]
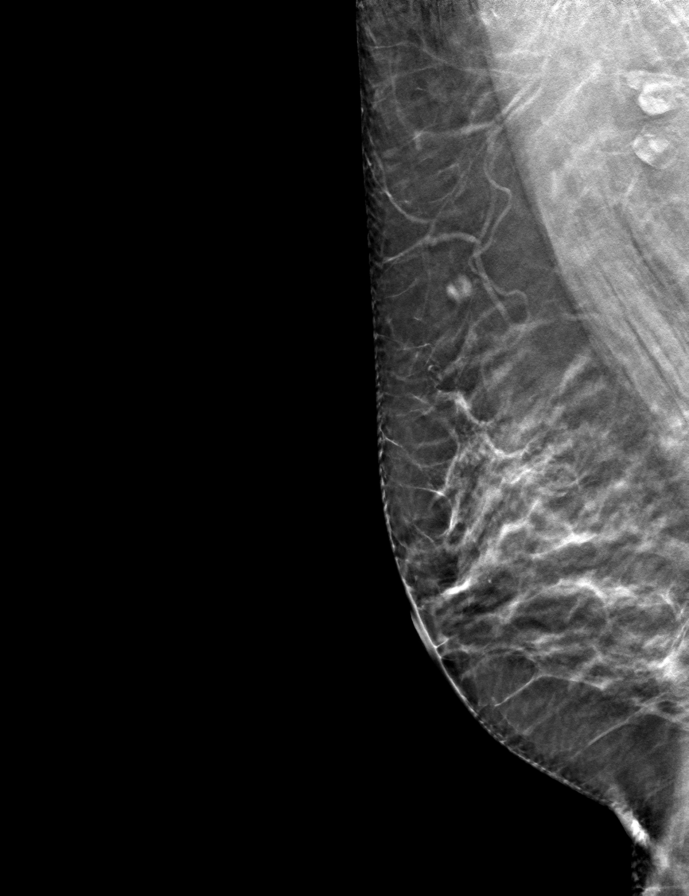

[R CC tomo · tomo slice 28/55.0]
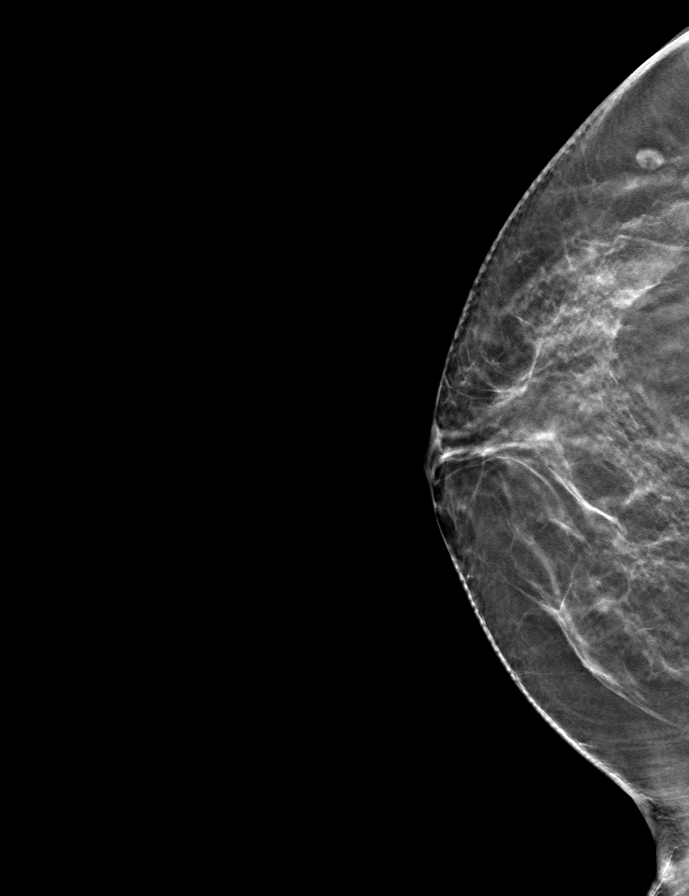

[L MLO tomo · tomo slice 31/62.0]
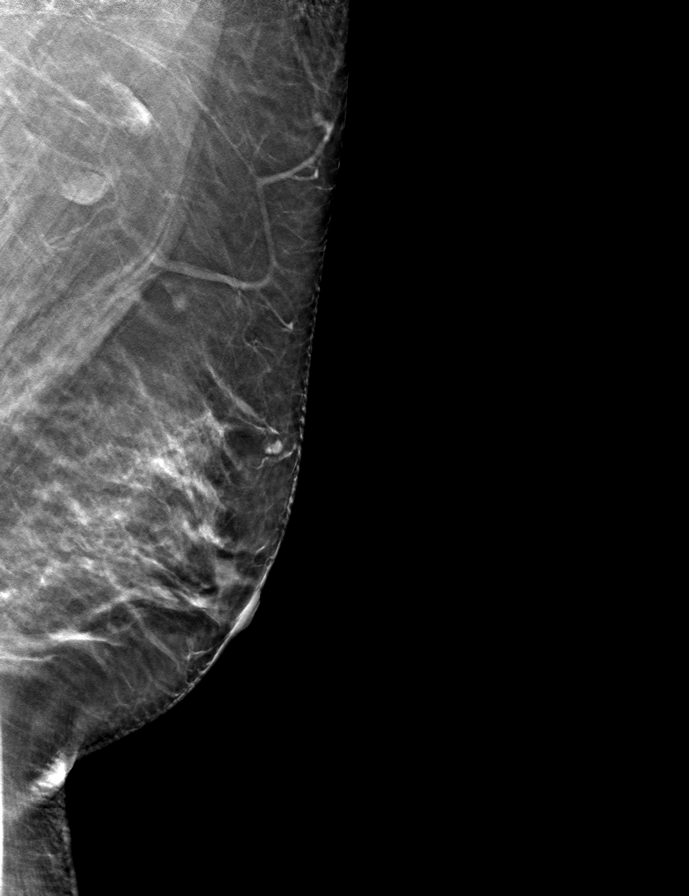

[9 of 24 positions shown; findings below may reference images not displayed]

ACR Breast Density Category b: There are scattered areas of
fibroglandular density.
FINDINGS: There are no findings suspicious for malignancy. Images were
processed with CAD.
IMPRESSION: No mammographic evidence of malignancy. A result letter of this
screening mammogram will be mailed directly to the patient.

RECOMMENDATION:
Screening mammogram in one year. (Code:[id number])

BI-RADS CATEGORY  1: Negative.

## 2020-11-03 ENCOUNTER — Other Ambulatory Visit: Payer: Self-pay

## 2020-12-14 ENCOUNTER — Other Ambulatory Visit: Payer: Self-pay

## 2020-12-14 ENCOUNTER — Telehealth: Payer: Self-pay | Admitting: *Deleted

## 2020-12-14 ENCOUNTER — Other Ambulatory Visit: Payer: Self-pay | Admitting: Nurse Practitioner

## 2020-12-14 DIAGNOSIS — E119 Type 2 diabetes mellitus without complications: Secondary | ICD-10-CM

## 2020-12-14 MED ORDER — GLIPIZIDE 10 MG PO TABS
ORAL_TABLET | Freq: Two times a day (BID) | ORAL | 0 refills | Status: DC
  Filled 2020-12-14: qty 60, 30d supply, fill #0

## 2020-12-14 MED FILL — Metformin HCl Tab 500 MG: ORAL | 60 days supply | Qty: 240 | Fill #0 | Status: AC

## 2020-12-14 MED FILL — Pravastatin Sodium Tab 40 MG: ORAL | 60 days supply | Qty: 60 | Fill #0 | Status: AC

## 2020-12-14 MED FILL — Lisinopril Tab 2.5 MG: ORAL | 90 days supply | Qty: 90 | Fill #0 | Status: AC

## 2020-12-14 NOTE — Telephone Encounter (Signed)
I called Heidi Shaw and made her an appt for 6 month check up and medication refills with Bertram [place] on 02/12/2021 at 4:10 in office visit.   Covid questionnaire completed. I used [employer] [phone] for Spanish.

## 2021-01-14 ENCOUNTER — Ambulatory Visit: Payer: Self-pay | Attending: Nurse Practitioner

## 2021-01-14 ENCOUNTER — Other Ambulatory Visit: Payer: Self-pay

## 2021-02-12 ENCOUNTER — Encounter: Payer: Self-pay | Admitting: Nurse Practitioner

## 2021-02-12 ENCOUNTER — Other Ambulatory Visit: Payer: Self-pay

## 2021-02-12 ENCOUNTER — Ambulatory Visit: Payer: Self-pay | Attending: Nurse Practitioner | Admitting: Nurse Practitioner

## 2021-02-12 VITALS — BP 106/73 | HR 83 | Ht 63.0 in | Wt 135.8 lb

## 2021-02-12 DIAGNOSIS — E559 Vitamin D deficiency, unspecified: Secondary | ICD-10-CM

## 2021-02-12 DIAGNOSIS — R5383 Other fatigue: Secondary | ICD-10-CM

## 2021-02-12 DIAGNOSIS — E785 Hyperlipidemia, unspecified: Secondary | ICD-10-CM

## 2021-02-12 DIAGNOSIS — G8929 Other chronic pain: Secondary | ICD-10-CM

## 2021-02-12 DIAGNOSIS — E119 Type 2 diabetes mellitus without complications: Secondary | ICD-10-CM

## 2021-02-12 DIAGNOSIS — M25512 Pain in left shoulder: Secondary | ICD-10-CM

## 2021-02-12 DIAGNOSIS — D649 Anemia, unspecified: Secondary | ICD-10-CM

## 2021-02-12 LAB — POCT GLYCOSYLATED HEMOGLOBIN (HGB A1C): HbA1c, POC (controlled diabetic range): 7.4 % — AB (ref 0.0–7.0)

## 2021-02-12 LAB — GLUCOSE, POCT (MANUAL RESULT ENTRY): POC Glucose: 87 mg/dl (ref 70–99)

## 2021-02-12 MED ORDER — PRAVASTATIN SODIUM 40 MG PO TABS
ORAL_TABLET | Freq: Every day | ORAL | 1 refills | Status: DC
  Filled 2021-02-12: qty 30, 30d supply, fill #0
  Filled 2021-03-13: qty 90, 90d supply, fill #1

## 2021-02-12 MED ORDER — DICLOFENAC SODIUM 1 % EX GEL
CUTANEOUS | 1 refills | Status: AC
  Filled 2021-02-12: qty 100, 12d supply, fill #0

## 2021-02-12 MED ORDER — LISINOPRIL 2.5 MG PO TABS
ORAL_TABLET | Freq: Every day | ORAL | 2 refills | Status: DC
  Filled 2021-02-12: qty 90, fill #0
  Filled 2021-03-13: qty 90, 90d supply, fill #0

## 2021-02-12 MED ORDER — TRUE METRIX BLOOD GLUCOSE TEST VI STRP
ORAL_STRIP | 12 refills | Status: DC
  Filled 2021-02-12: qty 200, 90d supply, fill #0

## 2021-02-12 MED ORDER — METFORMIN HCL 500 MG PO TABS
ORAL_TABLET | Freq: Two times a day (BID) | ORAL | 1 refills | Status: DC
  Filled 2021-02-12: qty 120, 30d supply, fill #0
  Filled 2021-03-13: qty 360, 90d supply, fill #1

## 2021-02-12 MED ORDER — GLIPIZIDE 10 MG PO TABS
ORAL_TABLET | Freq: Two times a day (BID) | ORAL | 0 refills | Status: DC
  Filled 2021-02-12: qty 60, 30d supply, fill #0
  Filled 2021-03-13: qty 120, 60d supply, fill #1

## 2021-02-12 NOTE — Progress Notes (Signed)
Assessment & Plan:  Heidi Shaw was seen today for diabetes.  Diagnoses and all orders for this visit:  Type 2 diabetes mellitus without complication, without long-term current use of insulin (HCC) -     POCT glucose (manual entry) -     POCT glycosylated hemoglobin (Hb A1C) -     CMP14+EGFR -     metFORMIN (GLUCOPHAGE) 500 MG tablet; TAKE 2 TABLETS (1,000 MG TOTAL) BY MOUTH 2 (TWO) TIMES DAILY WITH A MEAL. -     glipiZIDE (GLUCOTROL) 10 MG tablet; TAKE 1 TABLET (10 MG TOTAL) BY MOUTH 2 (TWO) TIMES DAILY BEFORE A MEAL. -     glucose blood (TRUE METRIX BLOOD GLUCOSE TEST) test strip; Use as instructed. Please fill as a 90 day supply -     lisinopril (ZESTRIL) 2.5 MG tablet; TAKE 1 TABLET (2.5 MG TOTAL) BY MOUTH DAILY. Continue blood sugar control as discussed in office today, low carbohydrate diet, and regular physical exercise as tolerated, 150 minutes per week (30 min each day, 5 days per week, or 50 min 3 days per week). Keep blood sugar logs with fasting goal of 90-130 mg/dl, post prandial (after you eat) less than 180.  For Hypoglycemia: BS <60 and Hyperglycemia BS >400; contact the clinic ASAP. Annual eye exams and foot exams are recommended.   Dyslipidemia, goal LDL below 70 -     Lipid panel -     pravastatin (PRAVACHOL) 40 MG tablet; TAKE 1 TABLET (40 MG TOTAL) BY MOUTH DAILY. INSTRUCTIONS: Work on a low fat, heart healthy diet and participate in regular aerobic exercise program by working out at least 150 minutes per week; 5 days a week-30 minutes per day. Avoid red meat/beef/steak,  fried foods. junk foods, sodas, sugary drinks, unhealthy snacking, alcohol and smoking.  Drink at least 80 oz of water per day and monitor your carbohydrate intake daily.    Anemia, unspecified type -     CBC  Vitamin D deficiency disease -     VITAMIN D 25 Hydroxy (Vit-D Deficiency, Fractures)  Fatigue, unspecified type -     TSH  Chronic left shoulder pain -     diclofenac Sodium (VOLTAREN) 1 %  GEL; APPLY 2 GRAMS TOPICALLY FOUR TIMES DAILY.   Patient has been counseled on age-appropriate routine health concerns for screening and prevention. These are reviewed and up-to-date. Referrals have been placed accordingly. Immunizations are up-to-date or declined.    Subjective:   Chief Complaint  Patient presents with   Diabetes   HPI Heidi Shaw 50 y.o. female presents to office today for follow up to DM.  DM 2 Not at goal. Currently taking glipizide 10 mg BID and metformin 500 mg BID. Denies any symptoms of hypo or hyperglycemia. LDL not at goal with pravastatin 40 mg daily. Recommended dietary modifications and increase exercise regimen.  Lab Results  Component Value Date   HGBA1C 7.4 (A) 02/12/2021   Lab Results  Component Value Date   HGBA1C 7.1 (A) 06/19/2020    Lab Results  Component Value Date   LDLCALC 116 (H) 08/15/2019     Notes increased fatigue. We did discuss her depression screening. She declines SSRI and states her symptoms are manageable.  Depression screen [facility] 2/9 02/12/2021 06/19/2020 11/15/2019 10/11/2018 07/14/2018  Decreased Interest 2 1 1 1 1   Down, Depressed, Hopeless 2 0 0 1 1  PHQ - 2 Score 4 1 1 2 2   Altered sleeping 1 1 1 1 1   Tired,  decreased energy 3 2 1 1 1   Change in appetite 2 1 1 1 1   Feeling bad or failure about yourself  2 1 1 1 1   Trouble concentrating 0 1 1 1  -  Moving slowly or fidgety/restless 0 0 0 0 -  Suicidal thoughts 0 0 0 0 -  PHQ-9 Score 12 7 6 7 6   Some recent data might be hidden    GAD 7 : Generalized Anxiety Score 02/12/2021 06/19/2020 11/15/2019 06/23/2018  Nervous, Anxious, on Edge 0 0 0 0  Control/stop worrying 0 0 0 1  Worry too much - different things 0 0 0 1  Trouble relaxing 0 1 1 1   Restless 0 0 0 1  Easily annoyed or irritable 0 - 1 1  Afraid - awful might happen 0 1 1 1   Total GAD 7 Score 0 - 3 6     Review of Systems  Constitutional:  Positive for malaise/fatigue. Negative for fever and weight  loss.  HENT: Negative.  Negative for nosebleeds.   Eyes: Negative.  Negative for blurred vision, double vision and photophobia.  Respiratory: Negative.  Negative for cough and shortness of breath.   Cardiovascular: Negative.  Negative for chest pain, palpitations and leg swelling.  Gastrointestinal: Negative.  Negative for heartburn, nausea and vomiting.  Musculoskeletal:  Positive for joint pain. Negative for myalgias.  Neurological: Negative.  Negative for dizziness, focal weakness, seizures and headaches.  Psychiatric/Behavioral: Negative.  Negative for suicidal ideas.    Past Medical History:  Diagnosis Date   Anemia    Diabetes mellitus without complication (HCC)    Heart murmur    Hyperlipemia     Past Surgical History:  Procedure Laterality Date   CESAREAN SECTION      Family History  Problem Relation Age of Onset   Diabetes Maternal Uncle    Diabetes Maternal Uncle     Social History Reviewed with no changes to be made today.   Outpatient Medications Prior to Visit  Medication Sig Dispense Refill   aspirin 81 MG tablet Take 1 tablet (81 mg total) by mouth daily. Reported on 08/09/2015 90 tablet 4   Blood Glucose Monitoring Suppl (TRUE METRIX METER) w/Device KIT Use to check blood sugars once a day. NEEDS NEW METER. METER NOT ACCURATE 1 kit 0   fluticasone (FLONASE) 50 MCG/ACT nasal spray Place 2 sprays into both nostrils daily. 16 g 6   mupirocin ointment (BACTROBAN) 2 % Apply 1 application topically 2 (two) times daily. 30 g 0   olopatadine (PATANOL) 0.1 % ophthalmic solution Place 1 drop into the left eye 2 (two) times daily. 5 mL 12   TRUEplus Lancets 28G MISC Use as prescribed. Please fill as a 90 day supply 200 each 6   diclofenac Sodium (VOLTAREN) 1 % GEL APPLY 2 GRAMS TOPICALLY FOUR TIMES DAILY. 200 g 1   glipiZIDE (GLUCOTROL) 10 MG tablet TAKE 1 TABLET (10 MG TOTAL) BY MOUTH 2 (TWO) TIMES DAILY BEFORE A MEAL. 180 tablet 0   glucose blood (TRUE METRIX BLOOD  GLUCOSE TEST) test strip Use as instructed. Please fill as a 90 day supply 200 each 12   lisinopril (ZESTRIL) 2.5 MG tablet TAKE 1 TABLET (2.5 MG TOTAL) BY MOUTH DAILY. 90 tablet 2   metFORMIN (GLUCOPHAGE) 500 MG tablet TAKE 2 TABLETS (1,000 MG TOTAL) BY MOUTH 2 (TWO) TIMES DAILY WITH A MEAL. 360 tablet 1   pravastatin (PRAVACHOL) 40 MG tablet TAKE 1 TABLET (40 MG TOTAL)  BY MOUTH DAILY. 90 tablet 1   predniSONE (DELTASONE) 10 MG tablet TAKE 2 TABLETS (20 MG TOTAL) BY MOUTH 2 (TWO) TIMES DAILY WITH A MEAL. 20 tablet 0   No facility-administered medications prior to visit.    No Known Allergies     Objective:    BP 106/73   Pulse 83   Ht 5' 3"  (1.6 m)   Wt 135 lb 12.8 oz (61.6 kg)   SpO2 97%   BMI 24.06 kg/m  Wt Readings from Last 3 Encounters:  02/12/21 135 lb 12.8 oz (61.6 kg)  06/26/20 133 lb (60.3 kg)  06/19/20 133 lb (60.3 kg)    Physical Exam Vitals and nursing note reviewed.  Constitutional:      Appearance: She is well-developed.  HENT:     Head: Normocephalic and atraumatic.  Cardiovascular:     Rate and Rhythm: Normal rate and regular rhythm.     Heart sounds: Normal heart sounds. No murmur heard.   No friction rub. No gallop.  Pulmonary:     Effort: Pulmonary effort is normal. No tachypnea or respiratory distress.     Breath sounds: Normal breath sounds. No decreased breath sounds, wheezing, rhonchi or rales.  Chest:     Chest wall: No tenderness.  Abdominal:     General: Bowel sounds are normal.     Palpations: Abdomen is soft.  Musculoskeletal:        General: Normal range of motion.     Cervical back: Normal range of motion.  Skin:    General: Skin is warm and dry.  Neurological:     Mental Status: She is alert and oriented to person, place, and time.     Coordination: Coordination normal.  Psychiatric:        Behavior: Behavior normal. Behavior is cooperative.        Thought Content: Thought content normal.        Judgment: Judgment normal.          Patient has been counseled extensively about nutrition and exercise as well as the importance of adherence with medications and regular follow-up. The patient was given clear instructions to go to ER or return to medical center if symptoms don't improve, worsen or new problems develop. The patient verbalized understanding.   Follow-up: Return in about 3 months (around 05/15/2021).   Gildardo Pounds, FNP-BC [facility] and [place], [place]   02/12/2021, 7:26 PM

## 2021-02-13 ENCOUNTER — Other Ambulatory Visit: Payer: Self-pay

## 2021-02-13 LAB — CBC
Hematocrit: 37.4 % (ref 34.0–46.6)
Hemoglobin: 12 g/dL (ref 11.1–15.9)
MCH: 25.2 pg — ABNORMAL LOW (ref 26.6–33.0)
MCHC: 32.1 g/dL (ref 31.5–35.7)
MCV: 79 fL (ref 79–97)
Platelets: 267 10*3/uL (ref 150–450)
RBC: 4.76 x10E6/uL (ref 3.77–5.28)
RDW: 14 % (ref 11.7–15.4)
WBC: 8.1 10*3/uL (ref 3.4–10.8)

## 2021-02-13 LAB — CMP14+EGFR
ALT: 16 IU/L (ref 0–32)
AST: 20 IU/L (ref 0–40)
Albumin/Globulin Ratio: 1.9 (ref 1.2–2.2)
Albumin: 4.8 g/dL (ref 3.8–4.8)
Alkaline Phosphatase: 93 IU/L (ref 44–121)
BUN/Creatinine Ratio: 21 (ref 9–23)
BUN: 12 mg/dL (ref 6–24)
Bilirubin Total: <0.2 mg/dL (ref 0.0–1.2)
CO2: 17 mmol/L — ABNORMAL LOW (ref 20–29)
Calcium: 10.1 mg/dL (ref 8.7–10.2)
Chloride: 101 mmol/L (ref 96–106)
Creatinine, Ser: 0.57 mg/dL (ref 0.57–1.00)
Globulin, Total: 2.5 g/dL (ref 1.5–4.5)
Glucose: 77 mg/dL (ref 65–99)
Potassium: 4.3 mmol/L (ref 3.5–5.2)
Sodium: 139 mmol/L (ref 134–144)
Total Protein: 7.3 g/dL (ref 6.0–8.5)
eGFR: 111 mL/min/{1.73_m2} (ref >59)

## 2021-02-13 LAB — LIPID PANEL
Chol/HDL Ratio: 4.3 ratio (ref 0.0–4.4)
Cholesterol, Total: 200 mg/dL — ABNORMAL HIGH (ref 100–199)
HDL: 47 mg/dL (ref >39)
LDL Chol Calc (NIH): 108 mg/dL — ABNORMAL HIGH (ref 0–99)
Triglycerides: 260 mg/dL — ABNORMAL HIGH (ref 0–149)
VLDL Cholesterol Cal: 45 mg/dL — ABNORMAL HIGH (ref 5–40)

## 2021-02-13 LAB — VITAMIN D 25 HYDROXY (VIT D DEFICIENCY, FRACTURES): Vit D, 25-Hydroxy: 23 ng/mL — ABNORMAL LOW (ref 30.0–100.0)

## 2021-02-13 LAB — TSH: TSH: 2.46 u[IU]/mL (ref 0.450–4.500)

## 2021-03-13 ENCOUNTER — Other Ambulatory Visit: Payer: Self-pay

## 2021-05-17 ENCOUNTER — Other Ambulatory Visit: Payer: Self-pay

## 2021-05-17 ENCOUNTER — Ambulatory Visit: Payer: Self-pay | Attending: Nurse Practitioner | Admitting: Nurse Practitioner

## 2021-05-17 ENCOUNTER — Encounter: Payer: Self-pay | Admitting: Nurse Practitioner

## 2021-05-17 VITALS — BP 110/77 | HR 79 | Ht 63.0 in | Wt 131.4 lb

## 2021-05-17 DIAGNOSIS — Z23 Encounter for immunization: Secondary | ICD-10-CM

## 2021-05-17 DIAGNOSIS — E785 Hyperlipidemia, unspecified: Secondary | ICD-10-CM

## 2021-05-17 DIAGNOSIS — E119 Type 2 diabetes mellitus without complications: Secondary | ICD-10-CM

## 2021-05-17 DIAGNOSIS — J3089 Other allergic rhinitis: Secondary | ICD-10-CM

## 2021-05-17 LAB — POCT GLYCOSYLATED HEMOGLOBIN (HGB A1C): Hemoglobin A1C: 7 % — AB (ref 4.0–5.6)

## 2021-05-17 LAB — GLUCOSE, POCT (MANUAL RESULT ENTRY): POC Glucose: 140 mg/dl — AB (ref 70–99)

## 2021-05-17 MED ORDER — LISINOPRIL 2.5 MG PO TABS
ORAL_TABLET | Freq: Every day | ORAL | 2 refills | Status: AC
  Filled 2021-05-17: qty 90, fill #0
  Filled 2021-07-17 – 2021-10-09 (×2): qty 90, 90d supply, fill #0

## 2021-05-17 MED ORDER — FLUTICASONE PROPIONATE 50 MCG/ACT NA SUSP
2.0000 | Freq: Every day | NASAL | 0 refills | Status: AC
  Filled 2021-05-17: qty 16, 30d supply, fill #0

## 2021-05-17 MED ORDER — TRUE METRIX BLOOD GLUCOSE TEST VI STRP
ORAL_STRIP | 12 refills | Status: AC
  Filled 2021-05-17: qty 200, 90d supply, fill #0

## 2021-05-17 MED ORDER — MUPIROCIN 2 % EX OINT
1.0000 "application " | TOPICAL_OINTMENT | Freq: Two times a day (BID) | CUTANEOUS | 0 refills | Status: AC
  Filled 2021-05-17: qty 22, 11d supply, fill #0

## 2021-05-17 MED ORDER — PRAVASTATIN SODIUM 40 MG PO TABS
ORAL_TABLET | Freq: Every day | ORAL | 1 refills | Status: AC
  Filled 2021-05-17: qty 90, fill #0
  Filled 2021-07-17 – 2021-10-09 (×3): qty 90, 90d supply, fill #0

## 2021-05-17 MED ORDER — METFORMIN HCL 500 MG PO TABS
ORAL_TABLET | Freq: Two times a day (BID) | ORAL | 1 refills | Status: AC
  Filled 2021-05-17: qty 360, fill #0
  Filled 2021-07-17 – 2021-10-09 (×2): qty 360, 90d supply, fill #0

## 2021-05-17 MED ORDER — GLIPIZIDE 10 MG PO TABS
ORAL_TABLET | Freq: Two times a day (BID) | ORAL | 0 refills | Status: DC
  Filled 2021-05-17: qty 180, 90d supply, fill #0

## 2021-05-17 NOTE — Progress Notes (Signed)
Assessment & Plan:  [name] was seen today for diabetes.  Diagnoses and all orders for this visit:  Type 2 diabetes mellitus without complication, without long-term current use of insulin (HCC) -     glipiZIDE (GLUCOTROL) 10 MG tablet; TAKE 1 TABLET (10 MG TOTAL) BY MOUTH 2 (TWO) TIMES DAILY BEFORE A MEAL. -     lisinopril (ZESTRIL) 2.5 MG tablet; TAKE 1 TABLET (2.5 MG TOTAL) BY MOUTH DAILY. -     glucose blood (TRUE METRIX BLOOD GLUCOSE TEST) test strip; Use as instructed. Please fill as a 90 day supply -     metFORMIN (GLUCOPHAGE) 500 MG tablet; TAKE 2 TABLETS (1,000 MG TOTAL) BY MOUTH 2 (TWO) TIMES DAILY WITH A MEAL. -     POCT glucose (manual entry) -     POCT glycosylated hemoglobin (Hb A1C) -     Basic metabolic panel Continue blood sugar control as discussed in office today, low carbohydrate diet, and regular physical exercise as tolerated, 150 minutes per week (30 min each day, 5 days per week, or 50 min 3 days per week). Keep blood sugar logs with fasting goal of 90-130 mg/dl, post prandial (after you eat) less than 180.  For Hypoglycemia: BS <60 and Hyperglycemia BS >400; contact the clinic ASAP. Annual eye exams and foot exams are recommended.   Dyslipidemia, goal LDL below 70 -     pravastatin (PRAVACHOL) 40 MG tablet; TAKE 1 TABLET (40 MG TOTAL) BY MOUTH DAILY. INSTRUCTIONS: Work on a low fat, heart healthy diet and participate in regular aerobic exercise program by working out at least 150 minutes per week; 5 days a week-30 minutes per day. Avoid red meat/beef/steak,  fried foods. junk foods, sodas, sugary drinks, unhealthy snacking, alcohol and smoking.  Drink at least 80 oz of water per day and monitor your carbohydrate intake daily.     Environmental and seasonal allergies -     fluticasone (FLONASE) 50 MCG/ACT nasal spray; Place 2 sprays into both nostrils daily.  Need for immunization against influenza -     Flu Vaccine QUAD 67moIM (Fluarix, Fluzone & Alfiuria Quad  PF)  Other orders -     mupirocin ointment (BACTROBAN) 2 %; Apply 1 application topically 2 (two) times daily.   Patient has been counseled on age-appropriate routine health concerns for screening and prevention. These are reviewed and up-to-date. Referrals have been placed accordingly. Immunizations are up-to-date or declined.    Subjective:   Chief Complaint  Patient presents with   Diabetes   Diabetes Pertinent negatives for hypoglycemia include no dizziness, headaches or seizures. Pertinent negatives for diabetes include no blurred vision, no chest pain and no weight loss.  Heidi Blossom89y.o. female presents to office today for follow up to DM  has a past medical history of Anemia, Diabetes mellitus without complication ([place], Heart murmur, and Hyperlipemia.   DM/HPL Well controlled.  She is taking glipizide 10 mg twice daily and metformin 500 mg twice daily.  She denies any symptoms of hypo or hyperglycemia.  She is on statin and renal dose ACE.  Blood pressure is well controlled today.  LDL is not at goal with pravastatin 40 mg daily. Lab Results  Component Value Date   HGBA1C 7.0 (A) 05/17/2021    Lab Results  Component Value Date   HGBA1C 7.4 (A) 02/12/2021    Lab Results  Component Value Date   LDLCALC 108 (H) 02/12/2021    BP Readings from Last 3 Encounters:  05/17/21 110/77  02/12/21 106/73  06/26/20 125/72  .  Review of Systems  Constitutional:  Negative for fever, malaise/fatigue and weight loss.  HENT: Negative.  Negative for nosebleeds.   Eyes: Negative.  Negative for blurred vision, double vision and photophobia.  Respiratory: Negative.  Negative for cough and shortness of breath.   Cardiovascular: Negative.  Negative for chest pain, palpitations and leg swelling.  Gastrointestinal: Negative.  Negative for heartburn, nausea and vomiting.  Musculoskeletal: Negative.  Negative for myalgias.  Neurological: Negative.  Negative for dizziness, focal  weakness, seizures and headaches.  Psychiatric/Behavioral: Negative.  Negative for suicidal ideas.    Past Medical History:  Diagnosis Date   Anemia    Diabetes mellitus without complication (HCC)    Heart murmur    Hyperlipemia     Past Surgical History:  Procedure Laterality Date   CESAREAN SECTION      Family History  Problem Relation Age of Onset   Diabetes Maternal Uncle    Diabetes Maternal Uncle     Social History Reviewed with no changes to be made today.   Outpatient Medications Prior to Visit  Medication Sig Dispense Refill   aspirin 81 MG tablet Take 1 tablet (81 mg total) by mouth daily. Reported on 08/09/2015 90 tablet 4   Blood Glucose Monitoring Suppl (TRUE METRIX METER) w/Device KIT Use to check blood sugars once a day. NEEDS NEW METER. METER NOT ACCURATE 1 kit 0   diclofenac Sodium (VOLTAREN) 1 % GEL APPLY 2 GRAMS TOPICALLY FOUR TIMES DAILY. 200 g 1   olopatadine (PATANOL) 0.1 % ophthalmic solution Place 1 drop into the left eye 2 (two) times daily. 5 mL 12   TRUEplus Lancets 28G MISC Use as prescribed. Please fill as a 90 day supply 200 each 6   fluticasone (FLONASE) 50 MCG/ACT nasal spray Place 2 sprays into both nostrils daily. 16 g 6   glipiZIDE (GLUCOTROL) 10 MG tablet TAKE 1 TABLET (10 MG TOTAL) BY MOUTH 2 (TWO) TIMES DAILY BEFORE A MEAL. 180 tablet 0   glucose blood (TRUE METRIX BLOOD GLUCOSE TEST) test strip Use as instructed. Please fill as a 90 day supply 200 each 12   lisinopril (ZESTRIL) 2.5 MG tablet TAKE 1 TABLET (2.5 MG TOTAL) BY MOUTH DAILY. 90 tablet 2   metFORMIN (GLUCOPHAGE) 500 MG tablet TAKE 2 TABLETS (1,000 MG TOTAL) BY MOUTH 2 (TWO) TIMES DAILY WITH A MEAL. 360 tablet 1   mupirocin ointment (BACTROBAN) 2 % Apply 1 application topically 2 (two) times daily. 30 g 0   pravastatin (PRAVACHOL) 40 MG tablet TAKE 1 TABLET (40 MG TOTAL) BY MOUTH DAILY. 90 tablet 1   No facility-administered medications prior to visit.    No Known Allergies      Objective:    BP 110/77   Pulse 79   Ht 5' 3"  (1.6 m)   Wt 131 lb 6 oz (59.6 kg)   SpO2 98%   BMI 23.27 kg/m  Wt Readings from Last 3 Encounters:  05/17/21 131 lb 6 oz (59.6 kg)  02/12/21 135 lb 12.8 oz (61.6 kg)  06/26/20 133 lb (60.3 kg)    Physical Exam Vitals and nursing note reviewed.  Constitutional:      Appearance: She is well-developed.  HENT:     Head: Normocephalic and atraumatic.  Cardiovascular:     Rate and Rhythm: Normal rate and regular rhythm.     Heart sounds: Normal heart sounds. No murmur heard.   No friction  rub. No gallop.  Pulmonary:     Effort: Pulmonary effort is normal. No tachypnea or respiratory distress.     Breath sounds: Normal breath sounds. No decreased breath sounds, wheezing, rhonchi or rales.  Chest:     Chest wall: No tenderness.  Abdominal:     General: Bowel sounds are normal.     Palpations: Abdomen is soft.  Musculoskeletal:        General: Normal range of motion.     Cervical back: Normal range of motion.  Skin:    General: Skin is warm and dry.  Neurological:     Mental Status: She is alert and oriented to person, place, and time.     Coordination: Coordination normal.  Psychiatric:        Behavior: Behavior normal. Behavior is cooperative.        Thought Content: Thought content normal.        Judgment: Judgment normal.         Patient has been counseled extensively about nutrition and exercise as well as the importance of adherence with medications and regular follow-up. The patient was given clear instructions to go to ER or return to medical center if symptoms don't improve, worsen or new problems develop. The patient verbalized understanding.   Follow-up: Return in about 3 months (around 08/17/2021).   Gildardo Pounds, FNP-BC [facility] and [place], [place]   05/17/2021, 4:16 PM

## 2021-05-18 LAB — BASIC METABOLIC PANEL
BUN/Creatinine Ratio: 13 (ref 9–23)
BUN: 11 mg/dL (ref 6–24)
CO2: 20 mmol/L (ref 20–29)
Calcium: 9.7 mg/dL (ref 8.7–10.2)
Chloride: 101 mmol/L (ref 96–106)
Creatinine, Ser: 0.84 mg/dL (ref 0.57–1.00)
Glucose: 132 mg/dL — ABNORMAL HIGH (ref 70–99)
Potassium: 4.1 mmol/L (ref 3.5–5.2)
Sodium: 138 mmol/L (ref 134–144)
eGFR: 85 mL/min/{1.73_m2} (ref >59)

## 2021-05-20 ENCOUNTER — Telehealth: Payer: Self-pay

## 2021-05-20 NOTE — Telephone Encounter (Signed)
-----   Message from Zelda W Fleming, NP sent at 05/19/2021  9:12 AM EDT ----- Kidney function and electrolytes are normal.   

## 2021-07-17 ENCOUNTER — Other Ambulatory Visit: Payer: Self-pay

## 2021-08-13 ENCOUNTER — Other Ambulatory Visit: Payer: Self-pay | Admitting: Nurse Practitioner

## 2021-08-13 ENCOUNTER — Other Ambulatory Visit: Payer: Self-pay

## 2021-08-13 DIAGNOSIS — E119 Type 2 diabetes mellitus without complications: Secondary | ICD-10-CM

## 2021-08-13 NOTE — Telephone Encounter (Signed)
Requested medications are due for refill today.  yes  Requested medications are on the active medications list.  yes  Last refill. 05/17/2021  Future visit scheduled.   no  Notes to clinic.  Pt last 05/17/2021. Pt was to RTC in 3 months 08/19/2021. Pt cancelled appointment. Please advise.    Requested Prescriptions  Pending Prescriptions Disp Refills   glipiZIDE (GLUCOTROL) 10 MG tablet 180 tablet 0    Sig: TAKE 1 TABLET (10 MG TOTAL) BY MOUTH 2 (TWO) TIMES DAILY BEFORE A MEAL.     Endocrinology:  Diabetes - Sulfonylureas Passed - 08/13/2021  4:20 PM      Passed - HBA1C is between 0 and 7.9 and within 180 days    Hemoglobin A1C  Date Value Ref Range Status  05/17/2021 7.0 (A) 4.0 - 5.6 % Final   Hemoglobin A1c  Date Value Ref Range Status  08/16/2015 7.4 (H) 4.8 - 5.6 % Final    Comment:             Pre-diabetes: 5.7 - 6.4          Diabetes: >6.4          Glycemic control for adults with diabetes: <7.0    HbA1c, POC (controlled diabetic range)  Date Value Ref Range Status  02/12/2021 7.4 (A) 0.0 - 7.0 % Final          Passed - Valid encounter within last 6 months    Recent Outpatient Visits           2 months ago Type 2 diabetes mellitus without complication, without long-term current use of insulin (HCC)   [place] [facility] [place], [place] W, NP   6 months ago Type 2 diabetes mellitus without complication, without long-term current use of insulin [facility])   [place] [facility] [place], [place] W, NP   1 year ago Type 2 diabetes mellitus without complication, without long-term current use of insulin [facility])   [place] [facility] [place], [place] W, NP   1 year ago Type 2 diabetes mellitus without complication, without long-term current use of insulin [facility])   [place] [facility] [place], [place] W, NP   1 year ago Type 2 diabetes mellitus without complication, without long-term current use of  insulin [facility]   [place] [facility] [place], Shea Stakes, NP

## 2021-08-15 ENCOUNTER — Other Ambulatory Visit: Payer: Self-pay

## 2021-08-15 MED ORDER — GLIPIZIDE 10 MG PO TABS
ORAL_TABLET | Freq: Two times a day (BID) | ORAL | 0 refills | Status: DC
  Filled 2021-08-15 – 2021-09-10 (×2): qty 60, 30d supply, fill #0

## 2021-08-19 ENCOUNTER — Ambulatory Visit: Payer: Self-pay | Admitting: Nurse Practitioner

## 2021-08-22 ENCOUNTER — Other Ambulatory Visit: Payer: Self-pay

## 2021-09-10 ENCOUNTER — Other Ambulatory Visit: Payer: Self-pay

## 2021-09-10 ENCOUNTER — Telehealth: Payer: Self-pay | Admitting: Nurse Practitioner

## 2021-09-10 NOTE — Telephone Encounter (Signed)
I return Pt call, schedule a financial appt 10/14/21

## 2021-09-10 NOTE — Telephone Encounter (Signed)
Copied from [place] [phone]. Topic: General - Other >> Sep 10, 2021  1:33 PM Leward Quan A wrote: Reason for CRM: Patient called in needing an appointment with [facility] card expired in December. Please call Ph# [phone]

## 2021-09-11 ENCOUNTER — Other Ambulatory Visit: Payer: Self-pay

## 2021-10-09 ENCOUNTER — Other Ambulatory Visit: Payer: Self-pay | Admitting: Family Medicine

## 2021-10-09 ENCOUNTER — Other Ambulatory Visit: Payer: Self-pay

## 2021-10-09 DIAGNOSIS — E119 Type 2 diabetes mellitus without complications: Secondary | ICD-10-CM

## 2021-10-10 ENCOUNTER — Other Ambulatory Visit: Payer: Self-pay

## 2021-10-10 MED ORDER — GLIPIZIDE 10 MG PO TABS
ORAL_TABLET | Freq: Two times a day (BID) | ORAL | 2 refills | Status: AC
  Filled 2021-10-10: qty 60, 30d supply, fill #0

## 2021-10-10 NOTE — Telephone Encounter (Signed)
Requested Prescriptions  ?Pending Prescriptions Disp Refills  ?? glipiZIDE (GLUCOTROL) 10 MG tablet 60 tablet 0  ?  Sig: TAKE 1 TABLET (10 MG TOTAL) BY MOUTH 2 (TWO) TIMES DAILY BEFORE A MEAL.  ?  ? Endocrinology:  Diabetes - Sulfonylureas Passed - 10/09/2021  4:22 PM  ?  ?  Passed - HBA1C is between 0 and 7.9 and within 180 days  ?  Hemoglobin A1C  ?Date Value Ref Range Status  ?05/17/2021 7.0 (A) 4.0 - 5.6 % Final  ? ?Hemoglobin A1c  ?Date Value Ref Range Status  ?08/16/2015 7.4 (H) 4.8 - 5.6 % Final  ?  Comment:  ?           Pre-diabetes: 5.7 - 6.4 ?         Diabetes: >6.4 ?         Glycemic control for adults with diabetes: <7.0 ?  ? ?HbA1c, POC (controlled diabetic range)  ?Date Value Ref Range Status  ?02/12/2021 7.4 (A) 0.0 - 7.0 % Final  ?   ?  ?  Passed - Cr in normal range and within 360 days  ?  Creat  ?Date Value Ref Range Status  ?02/14/2016 0.59 0.50 - 1.10 mg/dL Final  ? ?Creatinine, Ser  ?Date Value Ref Range Status  ?05/17/2021 0.84 0.57 - 1.00 mg/dL Final  ? ?Creatinine, Urine  ?Date Value Ref Range Status  ?10/11/2015 78 20 - 320 mg/dL Final  ?   ?  ?  Passed - Valid encounter within last 6 months  ?  Recent Outpatient Visits   ?      ? 4 months ago Type 2 diabetes mellitus without complication, without long-term current use of insulin (HCC)  ? [facility] [place], [place] W, NP  ? 8 months ago Type 2 diabetes mellitus without complication, without long-term current use of insulin (HCC)  ? [facility] [place], [place] W, NP  ? 1 year ago Type 2 diabetes mellitus without complication, without long-term current use of insulin (HCC)  ? [facility] [place], [place] W, NP  ? 1 year ago Type 2 diabetes mellitus without complication, without long-term current use of insulin (HCC)  ? [facility] [place], [place] W, NP  ? 1 year ago Type 2 diabetes mellitus without complication, without long-term  current use of insulin (HCC)  ? [facility] And Wellness [place] Rigg, NP  ?  ?  ?Future Appointments   ?        ? In 1 month Claiborne Rigg, NP [employer] [facility]  ?  ? ?  ?  ?  ? ? ?

## 2021-10-11 ENCOUNTER — Other Ambulatory Visit: Payer: Self-pay

## 2021-10-14 ENCOUNTER — Ambulatory Visit: Payer: Self-pay

## 2021-10-18 ENCOUNTER — Ambulatory Visit: Payer: Self-pay | Attending: Nurse Practitioner

## 2021-10-18 ENCOUNTER — Other Ambulatory Visit: Payer: Self-pay

## 2021-11-11 ENCOUNTER — Ambulatory Visit: Payer: Self-pay | Admitting: Nurse Practitioner

## 2022-01-27 ENCOUNTER — Ambulatory Visit: Payer: Self-pay | Admitting: Nurse Practitioner

## 2022-05-28 ENCOUNTER — Ambulatory Visit: Payer: Self-pay

## 2022-05-28 DIAGNOSIS — Z23 Encounter for immunization: Secondary | ICD-10-CM
